# Patient Record
Sex: Male | Born: 1970 | Race: Black or African American | Hispanic: No | Marital: Married | Smoking: Never smoker
Health system: Southern US, Community
[De-identification: ages and names within clinical notes are randomized; demographics above are authoritative.]

## PROBLEM LIST (undated history)

## (undated) DIAGNOSIS — E119 Type 2 diabetes mellitus without complications: Secondary | ICD-10-CM

---

## 2008-08-12 ENCOUNTER — Emergency Department (HOSPITAL_COMMUNITY): Admission: EM | Admit: 2008-08-12 | Discharge: 2008-08-12 | Payer: Self-pay | Admitting: Emergency Medicine

## 2011-11-13 ENCOUNTER — Emergency Department (HOSPITAL_COMMUNITY)
Admission: EM | Admit: 2011-11-13 | Discharge: 2011-11-13 | Disposition: A | Discharge status: Home / Self Care | Payer: BC Managed Care – PPO | Attending: Emergency Medicine | Admitting: Emergency Medicine

## 2011-11-13 ENCOUNTER — Encounter (HOSPITAL_COMMUNITY): Payer: Self-pay | Admitting: Emergency Medicine

## 2011-11-13 DIAGNOSIS — J3489 Other specified disorders of nose and nasal sinuses: Secondary | ICD-10-CM | POA: Insufficient documentation

## 2011-11-13 DIAGNOSIS — R05 Cough: Secondary | ICD-10-CM

## 2011-11-13 DIAGNOSIS — H11419 Vascular abnormalities of conjunctiva, unspecified eye: Secondary | ICD-10-CM | POA: Insufficient documentation

## 2011-11-13 DIAGNOSIS — H5789 Other specified disorders of eye and adnexa: Secondary | ICD-10-CM | POA: Insufficient documentation

## 2011-11-13 DIAGNOSIS — R059 Cough, unspecified: Secondary | ICD-10-CM

## 2011-11-13 DIAGNOSIS — R6883 Chills (without fever): Secondary | ICD-10-CM | POA: Insufficient documentation

## 2011-11-13 DIAGNOSIS — R6889 Other general symptoms and signs: Secondary | ICD-10-CM | POA: Insufficient documentation

## 2011-11-13 DIAGNOSIS — H109 Unspecified conjunctivitis: Secondary | ICD-10-CM

## 2011-11-13 DIAGNOSIS — J069 Acute upper respiratory infection, unspecified: Secondary | ICD-10-CM

## 2011-11-13 DIAGNOSIS — IMO0001 Reserved for inherently not codable concepts without codable children: Secondary | ICD-10-CM | POA: Insufficient documentation

## 2011-11-13 MED ORDER — AZITHROMYCIN 250 MG PO TABS: ORAL_TABLET | ORAL | Status: DC

## 2011-11-13 MED ORDER — AZITHROMYCIN 250 MG PO TABS
500.0000 mg | ORAL_TABLET | Freq: Once | ORAL | Status: AC
  Administered 2011-11-13: 500 mg via ORAL
  Filled 2011-11-13: qty 2

## 2011-11-13 MED ORDER — TOBRAMYCIN 0.3 % OP SOLN
2.0000 [drp] | Freq: Once | OPHTHALMIC | Status: AC
  Administered 2011-11-13: 2 [drp] via OPHTHALMIC

## 2011-11-13 NOTE — ED Notes (Signed)
Pt states has had upper respiratory infection symptoms x 4-5 days and this morning he awoke to drainage in bilateral eyes. Eyes are red and "sore". Throat is red, and "sore".  BBS are congested with fine rales.

## 2011-11-13 NOTE — ED Provider Notes (Signed)
History     CSN: [id number]  Arrival date & time 11/13/11  2126   First MD Initiated Contact with Patient 11/13/11 2204      Chief Complaint  Patient presents with  . Sore Throat  . Eye Drainage  . Cough    (Consider location/radiation/quality/duration/timing/severity/associated sxs/prior treatment) Patient is a 41 y.o. male presenting with cough. The history is provided by the patient. No language interpreter was used.  Cough This is a new problem. The current episode started more than 2 days ago. The problem occurs every few minutes. The problem has not changed since onset.The cough is productive of sputum. There has been no fever. Associated symptoms include chills, rhinorrhea, sore throat, myalgias and eye redness. Pertinent negatives include no chest pain, no sweats, no ear pain, no headaches, no shortness of breath and no wheezing. Associated symptoms comments: Redness and slight drainage to bilateral eyes for several days.  Denies swelling or visual changes. He has tried cough syrup for the symptoms. The treatment provided no relief. He is not a smoker. His past medical history does not include bronchitis, pneumonia or asthma.    History reviewed. No pertinent past medical history.  History reviewed. No pertinent past surgical history.  History reviewed. No pertinent family history.  History  Substance Use Topics  . Smoking status: Never Smoker   . Smokeless tobacco: Not on file  . Alcohol Use: No      Review of Systems  Constitutional: Positive for chills. Negative for fever, activity change and appetite change.  HENT: Positive for congestion, sore throat, rhinorrhea and sneezing. Negative for ear pain, facial swelling, trouble swallowing, neck pain and neck stiffness.   Eyes: Positive for discharge and redness. Negative for visual disturbance.  Respiratory: Positive for cough. Negative for chest tightness, shortness of breath and wheezing.   Cardiovascular: Negative  for chest pain.  Gastrointestinal: Negative for vomiting, abdominal pain and diarrhea.  Genitourinary: Negative for difficulty urinating.  Musculoskeletal: Positive for myalgias.  Neurological: Negative for dizziness, weakness and headaches.  Hematological: Negative for adenopathy.  All other systems reviewed and are negative.    Allergies  Review of patient's allergies indicates no known allergies.  Home Medications  No current outpatient prescriptions on file.  BP 121/83  Pulse 75  Temp(Src) 97.9 F (36.6 C) (Oral)  Resp 16  Ht 6' (1.829 m)  Wt 243 lb (110.224 kg)  BMI 32.96 kg/m2  SpO2 99%  Physical Exam  Nursing note and vitals reviewed. Constitutional: He is oriented to person, place, and time. He appears well-developed and well-nourished. No distress.  HENT:  Head: Normocephalic and atraumatic.  Mouth/Throat: Oropharynx is clear and moist.  Eyes: EOM are normal. Pupils are equal, round, and reactive to light. Right eye exhibits discharge. Right eye exhibits no chemosis and no hordeolum. No foreign body present in the right eye. Left eye exhibits no chemosis and no hordeolum. No foreign body present in the left eye. Right conjunctiva is injected. Left conjunctiva is not injected. Left conjunctiva has no hemorrhage. No scleral icterus.  Neck: Normal range of motion. Neck supple.  Cardiovascular: Normal rate, regular rhythm and normal heart sounds.   Pulmonary/Chest: Effort normal. No respiratory distress.       Coarse lung sounds bilaterally, no rales or wheezes  Abdominal: Soft. He exhibits no distension. There is no tenderness.  Musculoskeletal: Normal range of motion. He exhibits no edema and no tenderness.  Lymphadenopathy:    He has no cervical adenopathy.  Neurological:  He is alert and oriented to person, place, and time. He exhibits normal muscle tone. Coordination normal.  Skin: Skin is warm and dry.    ED Course  Procedures (including critical care  time)       MDM     Patient is alert, NAD.  Non-toxic appearing.  Coarse lung sounds bilaterally w/o wheezes or rales.  Probable bronchitis.  No tachypnea, tachycardia or hypoxia.  Likely bronchitis.  Pt agrees to close f/u with his PMD or to return here if sx's worsen.  Patient / Family / Caregiver understand and agree with initial ED impression and plan with expectations set for ED visit. Pt stable in ED with no significant deterioration in condition.      Jamese Trauger L. [place], [place] 11/16/11 1715

## 2011-11-13 NOTE — ED Notes (Signed)
Patient complaining of cough and sore throat x 4-5 days, redness and draining in bilateral eyes started today.

## 2011-11-16 NOTE — ED Provider Notes (Signed)
Medical screening examination/treatment/procedure(s) were performed by non-physician practitioner and as supervising physician I was immediately available for consultation/collaboration.   Geoffery Lyons, MD 11/16/11 2120

## 2013-11-09 ENCOUNTER — Emergency Department (HOSPITAL_COMMUNITY)
Admission: EM | Admit: 2013-11-09 | Discharge: 2013-11-09 | Disposition: A | Discharge status: Home / Self Care | Payer: BC Managed Care – PPO | Attending: Emergency Medicine | Admitting: Emergency Medicine

## 2013-11-09 ENCOUNTER — Encounter (HOSPITAL_COMMUNITY): Payer: Self-pay | Admitting: Emergency Medicine

## 2013-11-09 DIAGNOSIS — H6120 Impacted cerumen, unspecified ear: Secondary | ICD-10-CM | POA: Insufficient documentation

## 2013-11-09 DIAGNOSIS — Z792 Long term (current) use of antibiotics: Secondary | ICD-10-CM | POA: Insufficient documentation

## 2013-11-09 DIAGNOSIS — H6123 Impacted cerumen, bilateral: Secondary | ICD-10-CM

## 2013-11-09 DIAGNOSIS — H9190 Unspecified hearing loss, unspecified ear: Secondary | ICD-10-CM | POA: Insufficient documentation

## 2013-11-09 MED ORDER — CARBAMIDE PEROXIDE 6.5 % OT SOLN: 5.0000 [drp] | Freq: Two times a day (BID) | OTIC | Status: DC

## 2013-11-09 MED ORDER — ANTIPYRINE-BENZOCAINE 5.4-1.4 % OT SOLN
3.0000 [drp] | Freq: Once | OTIC | Status: AC
  Administered 2013-11-09: 4 [drp] via OTIC
  Filled 2013-11-09: qty 10

## 2013-11-09 MED ORDER — DOCUSATE SODIUM 50 MG/5ML PO LIQD
ORAL | Status: AC
  Filled 2013-11-09: qty 10

## 2013-11-09 MED ORDER — DOCUSATE SODIUM 50 MG/5ML PO LIQD
50.0000 mg | Freq: Once | ORAL | Status: AC
  Administered 2013-11-09: 50 mg via ORAL
  Filled 2013-11-09: qty 10

## 2013-11-09 NOTE — ED Notes (Signed)
Patient states that "I want my ears flushed." Patient c/o right ear problem but wants both ears "flushed."

## 2013-11-09 NOTE — Discharge Instructions (Signed)
Cerumen Impaction A cerumen impaction is when the wax in your ear forms a plug. This plug usually causes reduced hearing. Sometimes it also causes an earache or dizziness. Removing a cerumen impaction can be difficult and painful. The wax sticks to the ear canal. The canal is sensitive and bleeds easily. If you try to remove a heavy wax buildup with a cotton tipped swab, you may push it in further. Irrigation with water, suction, and small ear curettes may be used to clear out the wax. If the impaction is fixed to the skin in the ear canal, ear drops may be needed for a few days to loosen the wax. People who build up a lot of wax frequently can use ear wax removal products available in your local drugstore. SEEK MEDICAL CARE IF:  You develop an earache, increased hearing loss, or marked dizziness. Document Released: 10/28/2004 Document Revised: 12/13/2011 Document Reviewed: 12/18/2009 [employer] Patient Information 2014 [employer].  

## 2013-11-09 NOTE — ED Notes (Signed)
Both ears irrigated again and large hard pellets flushed from ears.  Patient states he can now hear.  Reviewed discharge instructions again

## 2013-11-09 NOTE — ED Notes (Signed)
Both ears irrigated by Dr Patria Maneampos

## 2013-11-09 NOTE — ED Provider Notes (Signed)
CSN: [id number]     Arrival date & time 11/09/13  0000 History  This chart was scribed for Ricardo CoKevin M Owyn Raulston, MD by Quintella ReichertMatthew Underwood, ED scribe.  This patient was seen in room APA05/[facility]05 and the patient's care was started at 12:49 AM.   Chief Complaint  Patient presents with  . Otalgia    The history is provided by the patient. No language interpreter was used.    HPI Comments: Hoy RegisterDennis E Kohn is a 43 y.o. male who presents to the Emergency Department complaining of bilateral cerumen impaction with associated decreased hearing and mild right ear discomfort.  Symptoms are mild and gradually-worsening, with no alleviating or aggravating factors.  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  He has no other complaints at this time.   History reviewed. No pertinent past medical history.  History reviewed. No pertinent past surgical history.  History reviewed. No pertinent family history.   History  Substance Use Topics  . Smoking status: Never Smoker   . Smokeless tobacco: Not on file  . Alcohol Use: No     Review of Systems A complete 10 system review of systems was obtained and all systems are negative except as noted in the HPI and PMH.    Allergies  Review of patient's allergies indicates no known allergies.  Home Medications   Current Outpatient Rx  Name  Route  Sig  Dispense  Refill  . azithromycin (ZITHROMAX Z-PAK) 250 MG tablet      Take one tablet po qd x 4 days   4 tablet   0   . carbamide peroxide (DEBROX) 6.5 % otic solution   Both Ears   Place 5 drops into both ears 2 (two) times daily.   15 mL   0    BP 128/69  Pulse 69  Temp(Src) 97.9 F (36.6 C) (Oral)  Resp 18  Ht 6' (1.829 m)  Wt 246 lb 6.4 oz (111.766 kg)  BMI 33.41 kg/m2  SpO2 100%  Physical Exam  Nursing note and vitals reviewed. Constitutional: He is oriented to person, place, and time. He appears well-developed and well-nourished.   HENT:  Head: Normocephalic.  Bilateral cerumen impaction, right greater than left  Eyes: EOM are normal.  Neck: Normal range of motion.  Pulmonary/Chest: Effort normal.  Abdominal: He exhibits no distension.  Musculoskeletal: Normal range of motion.  Neurological: He is alert and oriented to person, place, and time.  Psychiatric: He has a normal mood and affect.    ED Course  EAR CERUMEN REMOVAL Date/Time: 11/09/2013 3:12 AM Performed by: Ricardo CoAMPOS, Mercades Bajaj M Authorized by: Ricardo CoAMPOS, Chesley Veasey M Consent: Verbal consent obtained. Risks and benefits: risks, benefits and alternatives were discussed Consent given by: patient Patient identity confirmed: verbally with patient Local anesthetic: auralgan. Ceruminolytics applied: Ceruminolytics applied prior to the procedure. Location: right and left ear. Procedure type: curette and irrigation Patient sedated: no Patient tolerance: Patient tolerated the procedure well with no immediate complications. Comments: No success with cerumen removal   (including critical care time)  DIAGNOSTIC STUDIES: Oxygen Saturation is 100% on room air, normal by my interpretation.    COORDINATION OF CARE: 12:51 AM-Discussed treatment plan which includes flushing his ears.  Advised pt that in the future he should see his PCP or an ENT for issues with cerumen impaction, and that the ED is not an appropriate place to seek this treatment.  Pt expressed  understanding and agreed with plan.     Labs Review Labs Reviewed - No data to display  Imaging Review No results found.  EKG Interpretation   None       MDM   1. Impacted cerumen of both ears   unsuccessful. ENT referral   I personally performed the services described in this documentation, which was scribed in my presence. The recorded information has been reviewed and is accurate.      Ricardo Co, MD 11/09/13 [phone]

## 2014-02-03 ENCOUNTER — Emergency Department (HOSPITAL_COMMUNITY): Payer: BC Managed Care – PPO

## 2014-02-03 ENCOUNTER — Encounter (HOSPITAL_COMMUNITY): Payer: Self-pay | Admitting: Emergency Medicine

## 2014-02-03 ENCOUNTER — Emergency Department (HOSPITAL_COMMUNITY)
Admission: EM | Admit: 2014-02-03 | Discharge: 2014-02-04 | Disposition: A | Discharge status: Home / Self Care | Payer: BC Managed Care – PPO | Attending: Emergency Medicine | Admitting: Emergency Medicine

## 2014-02-03 DIAGNOSIS — IMO0002 Reserved for concepts with insufficient information to code with codable children: Secondary | ICD-10-CM

## 2014-02-03 DIAGNOSIS — L03019 Cellulitis of unspecified finger: Secondary | ICD-10-CM | POA: Insufficient documentation

## 2014-02-03 DIAGNOSIS — Z792 Long term (current) use of antibiotics: Secondary | ICD-10-CM | POA: Insufficient documentation

## 2014-02-03 MED ORDER — SULFAMETHOXAZOLE-TMP DS 800-160 MG PO TABS
1.0000 | ORAL_TABLET | Freq: Once | ORAL | Status: AC
  Administered 2014-02-03: 1 via ORAL
  Filled 2014-02-03 (×2): qty 1

## 2014-02-03 MED ORDER — LIDOCAINE HCL 2 % EX GEL
Freq: Once | CUTANEOUS | Status: AC
Start: 2014-02-03 — End: 2014-02-03
  Administered 2014-02-03: 10 via TOPICAL
  Filled 2014-02-03: qty 10

## 2014-02-03 MED ORDER — OXYCODONE-ACETAMINOPHEN 5-325 MG PO TABS
1.0000 | ORAL_TABLET | Freq: Once | ORAL | Status: AC
  Administered 2014-02-03: 1 via ORAL
  Filled 2014-02-03 (×2): qty 1

## 2014-02-03 MED ORDER — SULFAMETHOXAZOLE-TRIMETHOPRIM 800-160 MG PO TABS: 1.0000 | ORAL_TABLET | Freq: Two times a day (BID) | ORAL | Status: AC

## 2014-02-03 MED ORDER — HYDROCODONE-ACETAMINOPHEN 5-325 MG PO TABS: 1.0000 | ORAL_TABLET | ORAL | Status: DC | PRN

## 2014-02-03 NOTE — ED Provider Notes (Signed)
CSN: [id number]     Arrival date & time 02/03/14  2118 History   First MD Initiated Contact with Patient 02/03/14 2150     Chief Complaint  Patient presents with  . Finger Injury     (Consider location/radiation/quality/duration/timing/severity/associated sxs/prior Treatment) Patient is a 43 y.o. male presenting with hand pain. The history is provided by the patient.  Hand Pain This is a new problem. The problem occurs constantly. The problem has been gradually worsening. He has tried nothing for the symptoms.   Ricardo Oneill is a 43 y.o. male who presents to the ED with pain and swelling to the left index finger. The symptoms started about a week ago. He thinks he may have had a hang nail. There was a small amount of drainage at the base of the nail. The pain and redness has increased. He denies fever or chills, nausea or vomiting or other problems.   History reviewed. No pertinent past medical history. History reviewed. No pertinent past surgical history. History reviewed. No pertinent family history. History  Substance Use Topics  . Smoking status: Never Smoker   . Smokeless tobacco: Not on file  . Alcohol Use: No    Review of Systems Negative except as stated in HPI   Allergies  Review of patient's allergies indicates no known allergies.  Home Medications   Prior to Admission medications   Medication Sig Start Date End Date Taking? Authorizing Provider  azithromycin (ZITHROMAX Z-PAK) 250 MG tablet Take one tablet po qd x 4 days 11/13/11   Tammy L. Triplett, PA-C  carbamide peroxide (DEBROX) 6.5 % otic solution Place 5 drops into both ears 2 (two) times daily. 11/09/13   Lyanne CoKevin M Campos, MD   BP 127/77  Pulse 72  Temp(Src) 97.9 F (36.6 C) (Oral)  Resp 18  Ht 6' (1.829 m)  Wt 239 lb 6.4 oz (108.591 kg)  BMI 32.46 kg/m2  SpO2 98% Physical Exam  Nursing note and vitals reviewed. Constitutional: He is oriented to person, place, and time. He appears well-developed and  well-nourished. No distress.  HENT:  Head: Normocephalic.  Eyes: Conjunctivae and EOM are normal.  Neck: Neck supple.  Cardiovascular: Normal rate.   Pulmonary/Chest: Effort normal.  Musculoskeletal: Normal range of motion.       Left hand: He exhibits tenderness and swelling. Normal strength noted.       Hands: Tenderness and swelling noted at the nailbed of the left index finger.   Neurological: He is alert and oriented to person, place, and time. No cranial nerve deficit.  Skin: Skin is warm and dry.  Psychiatric: He has a normal mood and affect. His behavior is normal.    ED Course  Procedures  Topical lidocaine applied to the distal aspect of the left index finger. Using a # 11 blade area at nail bed lifted, small amount of drainage. Patient tolerated well.   Dg Finger Index Left  02/03/2014   CLINICAL DATA:  Finger injury with middle finger swelling at the distal tuft for 1 week.  EXAM: LEFT INDEX FINGER 2+V  COMPARISON:  None.  FINDINGS: There is soft tissue swelling of the distal index finger, especially at the base of the nail. There is no evidence of underlying fracture, dislocation, or osseous infection. No radiodense foreign body.  IMPRESSION: Soft tissue swelling without osseous abnormality.   Electronically Signed   By: Tiburcio PeaJonathan  Watts M.D.   On: 02/03/2014 23:31     MDM  43 y.o. male  with paronychia of left index finger. Stable for discharge without concern for felon at this time. Will start antibiotics and pain management and patient will call Dr. Merrilee SeashoreKuzma's office tomorrow for follow up. He will return here as needed for any problems.    Medication List    TAKE these medications       HYDROcodone-acetaminophen 5-325 MG per tablet  Commonly known as:  NORCO/VICODIN  Take 1 tablet by mouth every 4 (four) hours as needed.     sulfamethoxazole-trimethoprim 800-160 MG per tablet  Commonly known as:  BACTRIM DS,SEPTRA DS  Take 1 tablet by mouth 2 (two) times daily.        ASK your doctor about these medications       GNP MEGA MULTI FOR MEN PO  Take 1 packet by mouth daily.     OVER THE COUNTER MEDICATION  Take 2 capsules by mouth daily. [facility]           Azrael Maddix Orlene OchM Arnez Stoneking, NP 02/04/14 [phone]

## 2014-02-03 NOTE — ED Notes (Signed)
Presents to ED with c/o pain L. Index finger for about a week with increased pain and swelling today.

## 2014-02-03 NOTE — Discharge Instructions (Signed)
Call Dr. Merrilee SeashoreKuzma's office in the morning to schedule follow up. Return here as needed.  Paronychia Paronychia is an inflammatory reaction involving the folds of the skin surrounding the fingernail. This is commonly caused by an infection in the skin around a nail. The most common cause of paronychia is frequent wetting of the hands (as seen with bartenders, food servers, nurses or others who wet their hands). This makes the skin around the fingernail susceptible to infection by bacteria (germs) or fungus. Other predisposing factors are:  Aggressive manicuring.  Nail biting.  Thumb sucking. The most common cause is a staphylococcal (a type of germ) infection, or a fungal (Candida) infection. When caused by a germ, it usually comes on suddenly with redness, swelling, pus and is often painful. It may get under the nail and form an abscess (collection of pus), or form an abscess around the nail. If the nail itself is infected with a fungus, the treatment is usually prolonged and may require oral medicine for up to one year. Your caregiver will determine the length of time treatment is required. The paronychia caused by bacteria (germs) may largely be avoided by not pulling on hangnails or picking at cuticles. When the infection occurs at the tips of the finger it is called felon. When the cause of paronychia is from the herpes simplex virus (HSV) it is called herpetic whitlow. TREATMENT  When an abscess is present treatment is often incision and drainage. This means that the abscess must be cut open so the pus can get out. When this is done, the following home care instructions should be followed. HOME CARE INSTRUCTIONS   It is important to keep the affected fingers very dry. Rubber or plastic gloves over cotton gloves should be used whenever the hand must be placed in water.  Keep wound clean, dry and dressed as suggested by your caregiver between warm soaks or warm compresses.  Soak in warm water for  fifteen to twenty minutes three to four times per day for bacterial infections. Fungal infections are very difficult to treat, so often require treatment for long periods of time.  For bacterial (germ) infections take antibiotics (medicine which kill germs) as directed and finish the prescription, even if the problem appears to be solved before the medicine is gone.  Only take over-the-counter or prescription medicines for pain, discomfort, or fever as directed by your caregiver. SEEK IMMEDIATE MEDICAL CARE IF:  You have redness, swelling, or increasing pain in the wound.  You notice pus coming from the wound.  You have a fever.  You notice a bad smell coming from the wound or dressing. Document Released: 03/16/2001 Document Revised: 12/13/2011 Document Reviewed: 11/15/2008 [facility] Patient Information 2014 [employer], [place].

## 2014-02-03 NOTE — ED Notes (Addendum)
Patient complaining of swelling and possible infection to left index finger x 1 week. Denies known injury.

## 2014-02-04 NOTE — ED Notes (Signed)
Patient with no complaints at this time. Respirations even and unlabored. Skin warm/dry. Discharge instructions reviewed with patient at this time. Patient given opportunity to voice concerns/ask questions. Patient discharged at this time and left Emergency Department with steady gait.   

## 2014-02-06 NOTE — ED Provider Notes (Signed)
Medical screening examination/treatment/procedure(s) were performed by non-physician practitioner and as supervising physician I was immediately available for consultation/collaboration.   Journey Ratterman M Kirbie Stodghill, MD 02/06/14 2208 

## 2015-03-28 ENCOUNTER — Encounter (HOSPITAL_COMMUNITY): Payer: Self-pay | Admitting: *Deleted

## 2015-03-28 ENCOUNTER — Emergency Department (HOSPITAL_COMMUNITY)
Admission: EM | Admit: 2015-03-28 | Discharge: 2015-03-28 | Disposition: A | Discharge status: Home / Self Care | Payer: BLUE CROSS/BLUE SHIELD | Attending: Emergency Medicine | Admitting: Emergency Medicine

## 2015-03-28 DIAGNOSIS — H109 Unspecified conjunctivitis: Secondary | ICD-10-CM | POA: Diagnosis not present

## 2015-03-28 DIAGNOSIS — H578 Other specified disorders of eye and adnexa: Secondary | ICD-10-CM | POA: Diagnosis present

## 2015-03-28 DIAGNOSIS — J40 Bronchitis, not specified as acute or chronic: Secondary | ICD-10-CM | POA: Diagnosis not present

## 2015-03-28 MED ORDER — CIPROFLOXACIN HCL 0.3 % OP SOLN
OPHTHALMIC | Status: AC
  Filled 2015-03-28: qty 2.5

## 2015-03-28 MED ORDER — CIPROFLOXACIN HCL 0.3 % OP SOLN
2.0000 [drp] | OPHTHALMIC | Status: DC
  Administered 2015-03-28: 2 [drp] via OPHTHALMIC
  Filled 2015-03-28: qty 2.5

## 2015-03-28 NOTE — Discharge Instructions (Signed)

## 2015-03-28 NOTE — ED Notes (Signed)
Pt states he was mowing grass yesterday & right eye became irritated. Has not improved even after using the cleareyes drops.

## 2015-03-28 NOTE — ED Provider Notes (Signed)
CSN: [id number]     Arrival date & time 03/28/15  6767 History   First MD Initiated Contact with Patient 03/28/15 [phone]     Chief Complaint  Patient presents with  . Eye Problem      HPI Patient reports drainage from his right eye with past 4 hours.  He states this began after mowing the lawn.  He awoke with some crusting of his right eyelid.  He reports his vision is normal.  He denies eye pain.  He reports ongoing drainage of fluid from his right eye.  He reports productive cough for the past 4-5 days without shortness of breath.  No fevers or chills.  He reports a stuffy nose over the past several days as well.  His symptoms are mild in severity.  Nothing worsens or improves his symptoms.  He's tried over-the-counter eyedrops without improvement.   History reviewed. No pertinent past medical history. History reviewed. No pertinent past surgical history. No family history on file. History  Substance Use Topics  . Smoking status: Never Smoker   . Smokeless tobacco: Not on file  . Alcohol Use: No    Review of Systems  All other systems reviewed and are negative.     Allergies  Review of patient's allergies indicates no known allergies.  Home Medications   Prior to Admission medications   Medication Sig Start Date End Date Taking? Authorizing Provider  Multiple Vitamins-Minerals (GNP MEGA MULTI FOR MEN PO) Take 1 packet by mouth daily.   Yes Historical Provider, MD  OVER THE COUNTER MEDICATION Take 2 capsules by mouth daily. [employer] STAMINOL ULTRA   Yes Historical Provider, MD  HYDROcodone-acetaminophen (NORCO/VICODIN) 5-325 MG per tablet Take 1 tablet by mouth every 4 (four) hours as needed. 02/03/14   Hope Orlene Och, NP   BP 123/86 mmHg  Pulse 75  Temp(Src) 97.9 F (36.6 C) (Oral)  Resp 16  Ht 6' (1.829 m)  Wt 245 lb (111.131 kg)  BMI 33.22 kg/m2  SpO2 98% Physical Exam  Constitutional: He is oriented to person, place, and time. He appears well-developed and well-nourished.   HENT:  Head: Normocephalic and atraumatic.  Eyes: EOM are normal.  Small clear drainage from his right eye.  Patient with injected conjunctiva of right eye.  Patient with mild chemosis and right.  Pupil on his right eye is normal.   Neck: Normal range of motion.  Cardiovascular: Normal rate, regular rhythm, normal heart sounds and intact distal pulses.   Pulmonary/Chest: Effort normal and breath sounds normal. No respiratory distress.  Abdominal: Soft. He exhibits no distension. There is no tenderness.  Musculoskeletal: Normal range of motion.  Neurological: He is alert and oriented to person, place, and time.  Skin: Skin is warm and dry.  Psychiatric: He has a normal mood and affect. Judgment normal.  Nursing note and vitals reviewed.   ED Course  Procedures (including critical care time) Labs Review Labs Reviewed - No data to display  Imaging Review No results found.   EKG Interpretation None      MDM   Final diagnoses:  Conjunctivitis of right eye  Bronchitis    Likely viral conjunctivitis but could have some degree of allergy. Vision normal. Home on cipro eye drops. pcp follow up.     Azalia Bilis, MD 03/28/15 [phone]

## 2015-09-14 IMAGING — CR DG FINGER INDEX 2+V*L*
1 series · 1 of 1 positions shown · non-contrast
Comparison: None.

CLINICAL DATA: Finger injury with middle finger swelling at the
distal tuft for 1 week.

EXAM:
LEFT INDEX FINGER 2+V

[view not recorded]
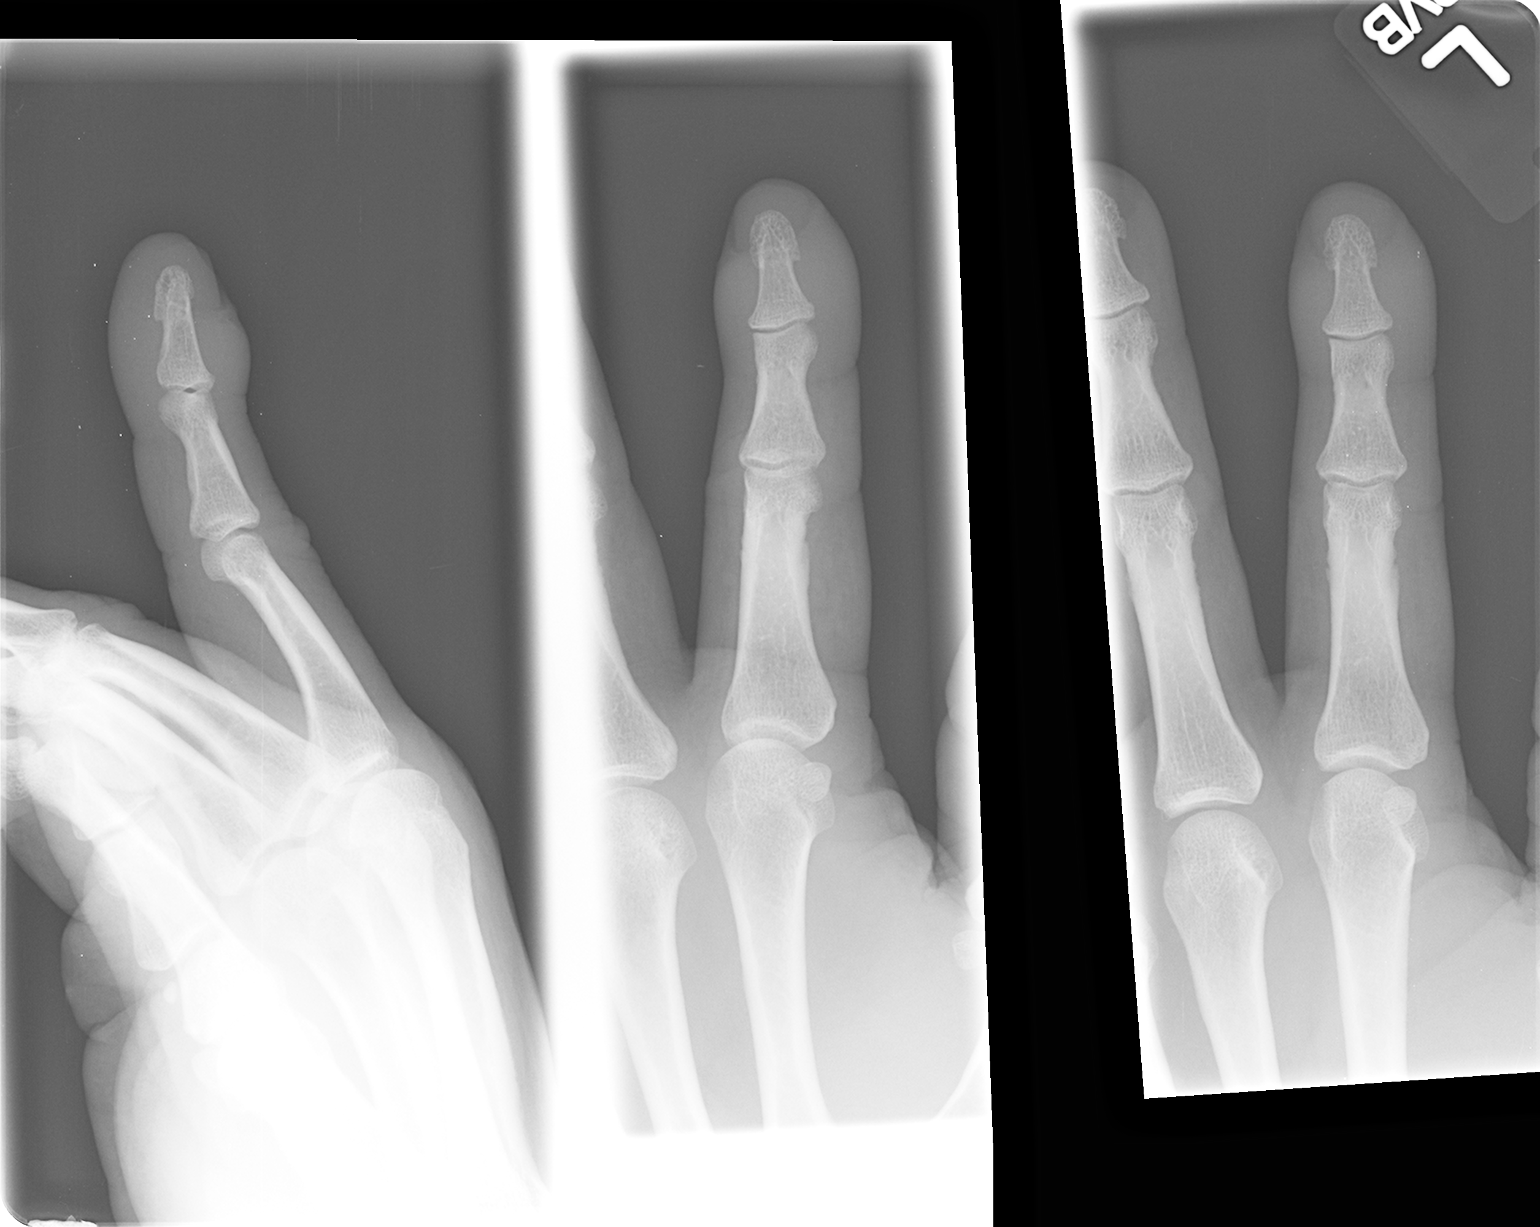

[1 of 1 positions shown; findings below may reference images not displayed]

FINDINGS: There is soft tissue swelling of the distal index finger, especially
at the base of the nail. There is no evidence of underlying
fracture, dislocation, or osseous infection. No radiodense foreign
body.
IMPRESSION: Soft tissue swelling without osseous abnormality.

## 2015-10-15 ENCOUNTER — Emergency Department (HOSPITAL_COMMUNITY)
Admission: EM | Admit: 2015-10-15 | Discharge: 2015-10-15 | Disposition: A | Discharge status: Home / Self Care | Payer: BLUE CROSS/BLUE SHIELD | Attending: Emergency Medicine | Admitting: Emergency Medicine

## 2015-10-15 ENCOUNTER — Encounter (HOSPITAL_COMMUNITY): Payer: Self-pay | Admitting: *Deleted

## 2015-10-15 DIAGNOSIS — H9193 Unspecified hearing loss, bilateral: Secondary | ICD-10-CM | POA: Diagnosis present

## 2015-10-15 DIAGNOSIS — H6123 Impacted cerumen, bilateral: Secondary | ICD-10-CM | POA: Diagnosis not present

## 2015-10-15 DIAGNOSIS — Z79899 Other long term (current) drug therapy: Secondary | ICD-10-CM | POA: Insufficient documentation

## 2015-10-15 DIAGNOSIS — J3489 Other specified disorders of nose and nasal sinuses: Secondary | ICD-10-CM | POA: Diagnosis not present

## 2015-10-15 MED ORDER — HYDROGEN PEROXIDE 3 % EX SOLN
CUTANEOUS | Status: AC
  Filled 2015-10-15: qty 473

## 2015-10-15 NOTE — ED Notes (Signed)
Pt states that he has earwax build up in both ears, hx of same

## 2015-10-15 NOTE — ED Notes (Signed)
Nurse flushed both of patients ears. Cerumen removed from both ears. Ear canal and ear drum now visible.

## 2015-10-15 NOTE — Discharge Instructions (Signed)
Cerumen Impaction The structures of the external ear canal secrete a waxy substance known as cerumen. Excess cerumen can build up in the ear canal, causing a condition known as cerumen impaction. Cerumen impaction can cause ear pain and disrupt the function of the ear. The rate of cerumen production differs for each individual. In certain individuals, the configuration of the ear canal may decrease his or her ability to naturally remove cerumen. CAUSES Cerumen impaction is caused by excessive cerumen production or buildup. RISK FACTORS  Frequent use of swabs to clean ears.  Having narrow ear canals.  Having eczema.  Being dehydrated. SIGNS AND SYMPTOMS  Diminished hearing.  Ear drainage.  Ear pain.  Ear itch. TREATMENT Treatment may involve:  Over-the-counter or prescription ear drops to soften the cerumen.  Removal of cerumen by a health care provider. This may be done with:  Irrigation with warm water. This is the most common method of removal.  Ear curettes and other instruments.  Surgery. This may be done in severe cases. HOME CARE INSTRUCTIONS  Take medicines only as directed by your health care provider.  Do not insert objects into the ear with the intent of cleaning the ear. PREVENTION  Do not insert objects into the ear, even with the intent of cleaning the ear. Removing cerumen as a part of normal hygiene is not necessary, and the use of swabs in the ear canal is not recommended.  Drink enough water to keep your urine clear or pale yellow.  Control your eczema if you have it. SEEK MEDICAL CARE IF:  You develop ear pain.  You develop bleeding from the ear.  The cerumen does not clear after you use ear drops as directed.   This information is not intended to replace advice given to you by your health care provider. Make sure you discuss any questions you have with your health care provider.   Document Released: 10/28/2004 Document Revised: 10/11/2014  Document Reviewed: 05/07/2015 Elsevier Interactive Patient Education 2016 [employer]  

## 2015-10-17 NOTE — ED Provider Notes (Signed)
CSN: [id number]     Arrival date & time 10/15/15  1819 History   First MD Initiated Contact with Patient 10/15/15 1825     Chief Complaint  Patient presents with  . Cerumen Impaction     (Consider location/radiation/quality/duration/timing/severity/associated sxs/prior Treatment) The history is provided by the patient.   Ricardo Oneill is a 45 y.o. male presenting requesting cerumen impaction removal which he states he has on a frequent basis.  He denies pain or drainage from the ears, does endorse decreased hearing acuity and suspects he is impacted.  He has had no fevers, sore throat, uri sx, dizziness, tinnitus or other complaint.  He has had no treatment prior to arrival.     History reviewed. No pertinent past medical history. History reviewed. No pertinent past surgical history. History reviewed. No pertinent family history. Social History  Substance Use Topics  . Smoking status: Never Smoker   . Smokeless tobacco: None  . Alcohol Use: No    Review of Systems  Constitutional: Negative for fever and chills.  HENT: Positive for hearing loss and rhinorrhea. Negative for congestion, ear pain, sinus pressure, sore throat, trouble swallowing and voice change.   Eyes: Negative for discharge.  Respiratory: Negative for cough, shortness of breath, wheezing and stridor.   Cardiovascular: Negative for chest pain.  Gastrointestinal: Negative for abdominal pain.  Genitourinary: Negative.       Allergies  Review of patient's allergies indicates no known allergies.  Home Medications   Prior to Admission medications   Medication Sig Start Date End Date Taking? Authorizing Provider  HYDROcodone-acetaminophen (NORCO/VICODIN) 5-325 MG per tablet Take 1 tablet by mouth every 4 (four) hours as needed. Patient not taking: Reported on 10/15/2015 02/03/14   Janne NapoleonHope M Neese, NP  Multiple Vitamins-Minerals (GNP MEGA MULTI FOR MEN PO) Take 1 packet by mouth daily.    Historical Provider, MD   OVER THE COUNTER MEDICATION Take 2 capsules by mouth daily. [employer] STAMINOL ULTRA    Historical Provider, MD   BP 131/74 mmHg  Pulse 77  Temp(Src) 97.5 F (36.4 C) (Oral)  Resp 18  Ht 6' (1.829 m)  Wt 115.667 kg  BMI 34.58 kg/m2  SpO2 98% Physical Exam  Constitutional: He is oriented to person, place, and time. He appears well-developed and well-nourished.  HENT:  Head: Normocephalic and atraumatic.  Right Ear: Tympanic membrane and ear canal normal.  Left Ear: Tympanic membrane and ear canal normal.  Nose: No mucosal edema or rhinorrhea.  Mouth/Throat: Uvula is midline, oropharynx is clear and moist and mucous membranes are normal. No oropharyngeal exudate, posterior oropharyngeal edema, posterior oropharyngeal erythema or tonsillar abscesses.  Bilateral cerumen impaction, appears soft,  Unable to visualize tm's.   Eyes: Conjunctivae are normal.  Cardiovascular: Normal rate and normal heart sounds.   Pulmonary/Chest: Effort normal. No respiratory distress. He has no wheezes. He has no rales.  Abdominal: Soft. There is no tenderness.  Musculoskeletal: Normal range of motion.  Neurological: He is alert and oriented to person, place, and time.  Skin: Skin is warm and dry. No rash noted.  Psychiatric: He has a normal mood and affect.    ED Course  Procedures (including critical care time)  Bilateral cerumen removed via NS flush using catheter tip and syringe by RN. Pt tolerated well.    Labs Review Labs Reviewed - No data to display  Imaging Review No results found. I have personally reviewed and evaluated these images and lab results as part of my  medical decision-making.   EKG Interpretation None      MDM   Final diagnoses:  Cerumen impaction, bilateral    Re-exam after flushing - pt with no acute findings, TM's clear, outer canal clear without trauma.  Mild erythema left base of canal, no swelling, no drainage.    Burgess Amor, PA-C 10/17/15 1347  Samuel Jester, DO 10/18/15 1605

## 2015-11-27 ENCOUNTER — Emergency Department (HOSPITAL_COMMUNITY): Payer: BLUE CROSS/BLUE SHIELD

## 2015-11-27 ENCOUNTER — Encounter (HOSPITAL_COMMUNITY): Payer: Self-pay | Admitting: Emergency Medicine

## 2015-11-27 ENCOUNTER — Emergency Department (HOSPITAL_COMMUNITY)
Admission: EM | Admit: 2015-11-27 | Discharge: 2015-11-27 | Disposition: A | Discharge status: Home / Self Care | Payer: BLUE CROSS/BLUE SHIELD | Attending: Emergency Medicine | Admitting: Emergency Medicine

## 2015-11-27 DIAGNOSIS — H109 Unspecified conjunctivitis: Secondary | ICD-10-CM

## 2015-11-27 DIAGNOSIS — J209 Acute bronchitis, unspecified: Secondary | ICD-10-CM | POA: Insufficient documentation

## 2015-11-27 DIAGNOSIS — J40 Bronchitis, not specified as acute or chronic: Secondary | ICD-10-CM

## 2015-11-27 DIAGNOSIS — Z79899 Other long term (current) drug therapy: Secondary | ICD-10-CM | POA: Diagnosis not present

## 2015-11-27 DIAGNOSIS — R05 Cough: Secondary | ICD-10-CM | POA: Diagnosis present

## 2015-11-27 MED ORDER — AZITHROMYCIN 250 MG PO TABS
500.0000 mg | ORAL_TABLET | Freq: Once | ORAL | Status: AC
  Administered 2015-11-27: 500 mg via ORAL
  Filled 2015-11-27: qty 2

## 2015-11-27 MED ORDER — AZITHROMYCIN 250 MG PO TABS: ORAL_TABLET | ORAL | Status: DC

## 2015-11-27 MED ORDER — TOBRAMYCIN 0.3 % OP OINT
TOPICAL_OINTMENT | Freq: Three times a day (TID) | OPHTHALMIC | Status: DC
  Filled 2015-11-27: qty 3.5

## 2015-11-27 MED ORDER — TOBRAMYCIN 0.3 % OP SOLN
1.0000 [drp] | Freq: Four times a day (QID) | OPHTHALMIC | Status: DC
  Administered 2015-11-27: 1 [drp] via OPHTHALMIC
  Filled 2015-11-27: qty 5

## 2015-11-27 NOTE — ED Provider Notes (Signed)
CSN: [id number]     Arrival date & time 11/27/15  2126 History   First MD Initiated Contact with Patient 11/27/15 2145     Chief Complaint  Patient presents with  . Cough  . Nasal Congestion     (Consider location/radiation/quality/duration/timing/severity/associated sxs/prior Treatment) Patient is a 45 y.o. male presenting with cough. The history is provided by the patient.  Cough Cough characteristics:  Productive Associated symptoms: eye discharge    Ricardo Oneill is a 45 y.o. male who presents to the ED with cough that is productive with yellow/green sputum and nasal congestion that started 2 days ago. He also complains of his eyes itching and being red and having crusting on the lids in the mornings.    History reviewed. No pertinent past medical history. History reviewed. No pertinent past surgical history. History reviewed. No pertinent family history. Social History  Substance Use Topics  . Smoking status: Never Smoker   . Smokeless tobacco: None  . Alcohol Use: No    Review of Systems  HENT: Positive for congestion.   Eyes: Positive for photophobia, discharge, redness and itching.  Respiratory: Positive for cough.   all other systems negative    Allergies  Review of patient's allergies indicates no known allergies.  Home Medications   Prior to Admission medications   Medication Sig Start Date End Date Taking? Authorizing Provider  azithromycin (ZITHROMAX) 250 MG tablet Take one tablet daily for infection 11/27/15   [facility] Orlene Och, NP  HYDROcodone-acetaminophen (NORCO/VICODIN) 5-325 MG per tablet Take 1 tablet by mouth every 4 (four) hours as needed. Patient not taking: Reported on 10/15/2015 02/03/14   Janne Napoleon, NP  Multiple Vitamins-Minerals (GNP MEGA MULTI FOR MEN PO) Take 1 packet by mouth daily.    Historical Provider, MD  OVER THE COUNTER MEDICATION Take 2 capsules by mouth daily. [employer] STAMINOL ULTRA    Historical Provider, MD   BP 120/79 mmHg  Pulse  79  Temp(Src) 98.1 F (36.7 C) (Oral)  Resp 14  Ht 6' (1.829 m)  Wt 113.853 kg  BMI 34.03 kg/m2  SpO2 99% Physical Exam  Constitutional: He is oriented to person, place, and time. He appears well-developed and well-nourished. No distress.  HENT:  Head: Normocephalic.  Eyes: EOM are normal. Pupils are equal, round, and reactive to light. Right eye exhibits discharge. Left eye exhibits discharge. Right conjunctiva is injected. Left conjunctiva is injected.  Neck: Neck supple.  Cardiovascular: Normal rate and regular rhythm.   Pulmonary/Chest: Effort normal. He has no rales.  Abdominal: Soft. Bowel sounds are normal. There is no tenderness.  Musculoskeletal: Normal range of motion.  Neurological: He is alert and oriented to person, place, and time. No cranial nerve deficit.  Skin: Skin is warm and dry.  Psychiatric: He has a normal mood and affect. His behavior is normal.  Nursing note and vitals reviewed.   ED Course  Procedures (including critical care time) Labs Review Labs Reviewed - No data to display  Imaging Review Dg Chest 2 View  11/27/2015  CLINICAL DATA:  Cough and congestion for 2 days. EXAM: CHEST  2 VIEW COMPARISON:  None. FINDINGS: The cardiomediastinal contours are normal. The lungs are clear. Pulmonary vasculature is normal. No consolidation, pleural effusion, or pneumothorax. No acute osseous abnormalities are seen. IMPRESSION: No acute pulmonary process. Electronically Signed   By: Rubye Oaks M.D.   On: 11/27/2015 21:42    MDM  45 y.o. male with productive cough, congestion and eye  redness and itching and discharge stable for d/c without fever and does not appear toxic. Will treat for bronchitis and conjunctivitis. First dose of Tobramycin Opth drops applied to eyes prior to d/c and first dose of Zithromax given. Discussed with the patient clinical findings and plan of careand all questioned fully answered. He will return if any problems arise.  Final  diagnoses:  Bronchitis  Bilateral conjunctivitis       Janne Napoleon, NP 11/28/15 1610  Vanetta Mulders, MD 12/01/15 [phone]

## 2015-11-27 NOTE — ED Notes (Signed)
Patient complaining of cough and nasal congestion x 2 days. Also complaining of "pink eyes that are draining and crusting."

## 2015-11-27 NOTE — Discharge Instructions (Signed)
Use Robitussin for cough. Follow up with your doctor. Return here as needed.

## 2016-04-13 ENCOUNTER — Encounter (HOSPITAL_COMMUNITY): Payer: Self-pay | Admitting: Emergency Medicine

## 2016-04-13 ENCOUNTER — Emergency Department (HOSPITAL_COMMUNITY)
Admission: EM | Admit: 2016-04-13 | Discharge: 2016-04-13 | Disposition: A | Discharge status: Home / Self Care | Payer: BLUE CROSS/BLUE SHIELD | Attending: Emergency Medicine | Admitting: Emergency Medicine

## 2016-04-13 DIAGNOSIS — H6691 Otitis media, unspecified, right ear: Secondary | ICD-10-CM | POA: Diagnosis not present

## 2016-04-13 DIAGNOSIS — H9201 Otalgia, right ear: Secondary | ICD-10-CM | POA: Diagnosis present

## 2016-04-13 MED ORDER — AMOXICILLIN 500 MG PO CAPS: 500.0000 mg | ORAL_CAPSULE | Freq: Three times a day (TID) | ORAL | Status: DC

## 2016-04-13 NOTE — Discharge Instructions (Signed)

## 2016-04-13 NOTE — ED Provider Notes (Signed)
CSN: [id number]     Arrival date & time 04/13/16  0845 History   First MD Initiated Contact with Patient 04/13/16 0848     Chief Complaint  Patient presents with  . Otalgia     (Consider location/radiation/quality/duration/timing/severity/associated sxs/prior Treatment) Patient is a 45 y.o. male presenting with ear pain. The history is provided by the patient. No language interpreter was used.  Otalgia Location:  Bilateral Quality:  Aching Severity:  Moderate Onset quality:  Gradual Timing:  Constant Progression:  Worsening Chronicity:  New Relieved by:  Nothing Ineffective treatments:  None tried Associated symptoms: no rhinorrhea and no sore throat   Risk factors: no chronic ear infection     History reviewed. No pertinent past medical history. History reviewed. No pertinent past surgical history. No family history on file. Social History  Substance Use Topics  . Smoking status: Never Smoker   . Smokeless tobacco: None  . Alcohol Use: No    Review of Systems  HENT: Positive for ear pain. Negative for rhinorrhea and sore throat.   All other systems reviewed and are negative.     Allergies  Review of patient's allergies indicates no known allergies.  Home Medications   Prior to Admission medications   Medication Sig Start Date End Date Taking? Authorizing Provider  amoxicillin (AMOXIL) 500 MG capsule Take 1 capsule (500 mg total) by mouth 3 (three) times daily. 04/13/16   Elson Areas, PA-C  azithromycin [facility]) 250 MG tablet Take one tablet daily for infection 11/27/15   [facility] Orlene Och, NP  HYDROcodone-acetaminophen (NORCO/VICODIN) 5-325 MG per tablet Take 1 tablet by mouth every 4 (four) hours as needed. Patient not taking: Reported on 10/15/2015 02/03/14   Janne Napoleon, NP  Multiple Vitamins-Minerals (GNP MEGA MULTI FOR MEN PO) Take 1 packet by mouth daily.    Historical Provider, MD  OVER THE COUNTER MEDICATION Take 2 capsules by mouth daily. [employer] STAMINOL  ULTRA    Historical Provider, MD   BP 117/77 mmHg  Pulse 75  Temp(Src) 97.7 F (36.5 C) (Oral)  Resp 18  Ht 6' (1.829 m)  Wt 113.399 kg  BMI 33.90 kg/m2  SpO2 95% Physical Exam  Constitutional: He is oriented to person, place, and time. He appears well-developed and well-nourished.  HENT:  Head: Normocephalic and atraumatic.  Right canla clear,  Erythema tm.  Left canal wax  Eyes: Conjunctivae and EOM are normal.  Neck: Normal range of motion.  Cardiovascular: Normal rate and normal heart sounds.   Pulmonary/Chest: Effort normal.  Abdominal: Soft. He exhibits no distension.  Musculoskeletal: Normal range of motion.  Neurological: He is alert and oriented to person, place, and time.  Skin: Skin is warm.  Psychiatric: He has a normal mood and affect.  Nursing note and vitals reviewed.   ED Course  Procedures (including critical care time) Labs Review Labs Reviewed - No data to display  Imaging Review No results found. I have personally reviewed and evaluated these images and lab results as part of my medical decision-making.   EKG Interpretation None      MDM wax irrigated out of left canal.  Pt given rx for amoxicillin for possible infection to left tm   Final diagnoses:  Right otitis media, recurrence not specified, unspecified chronicity, unspecified otitis media type   Meds ordered this encounter  Medications  . amoxicillin (AMOXIL) 500 MG capsule    Sig: Take 1 capsule (500 mg total) by mouth 3 (three) times daily.  Dispense:  30 capsule    Refill:  0    Order Specific Question:  Supervising Provider    Answer:  Eber HongMILLER, BRIAN [[id number]]    An After Visit Summary was printed and given to the patient.   Lonia SkinnerLeslie K MeccaSofia, PA-C 04/13/16 1013  Lavera Guiseana Duo Liu, MD 04/14/16 [phone]

## 2016-04-13 NOTE — ED Notes (Signed)
Left ear irrigated with removal of large wax ball. Pt tolerated well

## 2016-04-13 NOTE — ED Notes (Signed)
Pt reports bilateral otalgia. States ears feel stopped up. Reports he usually comes to ED to have ears cleaned.

## 2016-09-04 ENCOUNTER — Emergency Department (HOSPITAL_COMMUNITY)
Admission: EM | Admit: 2016-09-04 | Discharge: 2016-09-04 | Disposition: A | Discharge status: Home / Self Care | Payer: BLUE CROSS/BLUE SHIELD | Attending: Emergency Medicine | Admitting: Emergency Medicine

## 2016-09-04 ENCOUNTER — Encounter (HOSPITAL_COMMUNITY): Payer: Self-pay | Admitting: Emergency Medicine

## 2016-09-04 DIAGNOSIS — Z79899 Other long term (current) drug therapy: Secondary | ICD-10-CM | POA: Insufficient documentation

## 2016-09-04 DIAGNOSIS — M79644 Pain in right finger(s): Secondary | ICD-10-CM | POA: Diagnosis present

## 2016-09-04 DIAGNOSIS — L03011 Cellulitis of right finger: Secondary | ICD-10-CM

## 2016-09-04 MED ORDER — IBUPROFEN 600 MG PO TABS: 600.0000 mg | ORAL_TABLET | Freq: Four times a day (QID) | ORAL | 0 refills | Status: DC | PRN

## 2016-09-04 MED ORDER — LIDOCAINE HCL (PF) 1 % IJ SOLN
5.0000 mL | Freq: Once | INTRAMUSCULAR | Status: AC
  Administered 2016-09-04: 5 mL
  Filled 2016-09-04: qty 5

## 2016-09-04 MED ORDER — CEPHALEXIN 500 MG PO CAPS: 500.0000 mg | ORAL_CAPSULE | Freq: Two times a day (BID) | ORAL | 0 refills | Status: AC

## 2016-09-04 NOTE — ED Provider Notes (Signed)
[facility]-EMERGENCY DEPT Provider Note   CSN: [id number] Arrival date & time: 09/04/16  1244  By signing my name below, I, Ricardo Oneill, attest that this documentation has been prepared under the direction and in the presence of non-physician practitioner, Audry Piliyler Maleiyah Releford, PA-C. Electronically Signed: Nelwyn SalisburyJoshua Oneill, Scribe. 09/04/2016. 1:06 PM.  History   Chief Complaint Chief Complaint  Patient presents with  . Hand Pain   HPI  HPI Comments:  Ricardo Oneill is an otherwise healthy 45 y.o. male who presents to the Emergency Department complaining of sudden-onset constant right thumbnail pain beginning about 2 days ago. He describes symptoms as an 8/10 throbbing pain exacerbated by palpation. Pt states that he was clipping his nails that day and that he has been told that he clips his nails too close. He reports associated joint swelling and joint pain. He denies any fever, wound, weakness or numbness.  History reviewed. No pertinent past medical history.  There are no active problems to display for this patient.   History reviewed. No pertinent surgical history.   Home Medications    Prior to Admission medications   Medication Sig Start Date End Date Taking? Authorizing Provider  amoxicillin (AMOXIL) 500 MG capsule Take 1 capsule (500 mg total) by mouth 3 (three) times daily. 04/13/16   Elson AreasLeslie K Sofia, PA-C  azithromycin [facility](ZITHROMAX) 250 MG tablet Take one tablet daily for infection 11/27/15   [facility] Orlene OchM Neese, NP  HYDROcodone-acetaminophen (NORCO/VICODIN) 5-325 MG per tablet Take 1 tablet by mouth every 4 (four) hours as needed. Patient not taking: Reported on 10/15/2015 02/03/14   Janne NapoleonHope M Neese, NP  Multiple Vitamins-Minerals (GNP MEGA MULTI FOR MEN PO) Take 1 packet by mouth daily.    Historical Provider, MD  OVER THE COUNTER MEDICATION Take 2 capsules by mouth daily. [employer] STAMINOL ULTRA    Historical Provider, MD    Family History History reviewed. No pertinent family history.  Social  History Social History  Substance Use Topics  . Smoking status: Never Smoker  . Smokeless tobacco: Never Used  . Alcohol use No     Allergies   Patient has no known allergies.   Review of Systems Review of Systems  Constitutional: Negative for fever.  Musculoskeletal: Positive for joint swelling and myalgias (Right distal thumb). Negative for arthralgias.  Skin: Negative for wound.  Neurological: Negative for weakness and numbness.     Physical Exam Updated Vital Signs BP 146/85 (BP Location: Right Arm)   Pulse 66   Temp 98.4 F (36.9 C) (Oral)   Resp 20   Ht 6' (1.829 m)   Wt 250 lb (113.4 kg)   SpO2 100%   BMI 33.91 kg/m   Physical Exam  Constitutional: He is oriented to person, place, and time. He appears well-developed and well-nourished. No distress.  HENT:  Head: Normocephalic and atraumatic.  Eyes: Conjunctivae are normal.  Cardiovascular: Normal rate.   Pulmonary/Chest: Effort normal.  Abdominal: He exhibits no distension.  Neurological: He is alert and oriented to person, place, and time.  Skin: Skin is warm and dry. Capillary refill takes less than 2 seconds.  Right thumb tender to palpation. Lateral cuticle minimal swelling noted. No erythema. ROM intact. Cap refill <2. Neurovascularly intact.   Psychiatric: He has a normal mood and affect.  Nursing note and vitals reviewed.    ED Treatments / Results  DIAGNOSTIC STUDIES:  Oxygen Saturation is 100% on RA, normal by my interpretation.    COORDINATION OF CARE:  1:16 PM  Discussed treatment plan with pt at bedside and pt agreed to plan.  Labs (all labs ordered are listed, but only abnormal results are displayed) Labs Reviewed - No data to display  EKG  EKG Interpretation None       Radiology No results found.  Procedures .Marland Kitchen.Incision and Drainage Date/Time: 09/04/2016 1:22 PM Performed by: Audry PiliMOHR, Anala Whisenant Authorized by: Audry PiliMOHR, Yaeli Hartung   Consent:    Consent obtained:  Verbal   Consent  given by:  Patient Location:    Location:  Upper extremity   Upper extremity location:  Finger   Finger location:  R thumb Pre-procedure details:    Skin preparation:  Antiseptic wash Anesthesia (see MAR for exact dosages):    Anesthesia method:  Local infiltration   Local anesthetic:  Lidocaine 1% w/o epi Procedure type:    Complexity:  Simple Procedure details:    Needle aspiration: no     Incision types:  Single straight   Incision depth:  Dermal   Scalpel blade:  11   Drainage:  Purulent   Drainage amount:  Moderate Post-procedure details:    Patient tolerance of procedure:  Tolerated well, no immediate complications    (including critical care time)  Medications Ordered in ED Medications - No data to display   Initial Impression / Assessment and Plan / ED Course  I have reviewed the triage vital signs and the nursing notes.  Pertinent labs & imaging results that were available during my care of the patient were reviewed by me and considered in my medical decision making (see chart for details).  Clinical Course     Final Clinical Impressions(s) / ED Diagnoses     {I have reviewed the relevant previous healthcare records.  {I obtained HPI from historian.   ED Course:  Assessment:  Patient with paronychia of right thumb. Incision and drainage performed in the ED today.  Incision was not large enough to warrant packing or drain placement. Wound recheck in 2 days. Supportive care and return precautions discussed.  Pt sent home with ABX. The patient appears reasonably screened and/or stabilized for discharge and I doubt any other emergent medical condition requiring further screening, evaluation, or treatment in the ED prior to discharge.   Disposition/Plan:  DC Home Additional Verbal discharge instructions given and discussed with patient.  Pt Instructed to f/u with PCP in the next week for evaluation and treatment of symptoms. Return precautions given Pt  acknowledges and agrees with plan  Supervising Physician No att. providers found  Final diagnoses:  Paronychia of right thumb    New Prescriptions New Prescriptions   No medications on file   I personally performed the services described in this documentation, which was scribed in my presence. The recorded information has been reviewed and is accurate.    Audry Piliyler Elwanda Moger, PA-C 09/04/16 1418    Bethann BerkshireJoseph Zammit, MD 09/05/16 [phone]

## 2016-09-04 NOTE — Discharge Instructions (Signed)
Please read and follow all provided instructions.  Your diagnoses today include:  1. Paronychia of right thumb     Tests performed today include: Vital signs. See below for your results today.   Medications prescribed:  Take as prescribed   Home care instructions:  Follow any educational materials contained in this packet.  Follow-up instructions: Please follow-up with your primary care provider for further evaluation of symptoms and treatment   Return instructions:  Please return to the Emergency Department if you do not get better, if you get worse, or new symptoms OR  - Fever (temperature greater than 101.64F)  - Bleeding that does not stop with holding pressure to the area    -Severe pain (please note that you may be more sore the day after your accident)  - Chest Pain  - Difficulty breathing  - Severe nausea or vomiting  - Inability to tolerate food and liquids  - Passing out  - Skin becoming red around your wounds  - Change in mental status (confusion or lethargy)  - New numbness or weakness    Please return if you have any other emergent concerns.  Additional Information:  Your vital signs today were: BP 146/85 (BP Location: Right Arm)    Pulse 66    Temp 98.4 F (36.9 C) (Oral)    Resp 20    Ht 6' (1.829 m)    Wt 113.4 kg    SpO2 100%    BMI 33.91 kg/m  If your blood pressure (BP) was elevated above 135/85 this visit, please have this repeated by your doctor within one month. ---------------

## 2016-09-04 NOTE — ED Triage Notes (Signed)
Patient c/o right thumb nail pain. Per patient started Thursday and he was unable to sleep due to pain last night. Patient states he cut his fingernails Wednesday. No obvious injury or drainage noted.

## 2016-11-22 ENCOUNTER — Encounter (HOSPITAL_COMMUNITY): Payer: Self-pay | Admitting: Emergency Medicine

## 2016-11-22 ENCOUNTER — Emergency Department (HOSPITAL_COMMUNITY)
Admission: EM | Admit: 2016-11-22 | Discharge: 2016-11-23 | Disposition: A | Discharge status: Home / Self Care | Payer: BLUE CROSS/BLUE SHIELD | Attending: Emergency Medicine | Admitting: Emergency Medicine

## 2016-11-22 DIAGNOSIS — L02811 Cutaneous abscess of head [any part, except face]: Secondary | ICD-10-CM | POA: Insufficient documentation

## 2016-11-22 DIAGNOSIS — L0211 Cutaneous abscess of neck: Secondary | ICD-10-CM | POA: Insufficient documentation

## 2016-11-22 DIAGNOSIS — L0291 Cutaneous abscess, unspecified: Secondary | ICD-10-CM

## 2016-11-22 NOTE — ED Triage Notes (Signed)
Patient complaining of abscess to back of head "for a while."

## 2016-11-23 MED ORDER — DOXYCYCLINE HYCLATE 100 MG PO CAPS: 100.0000 mg | ORAL_CAPSULE | Freq: Two times a day (BID) | ORAL | 0 refills | Status: DC

## 2016-11-23 MED ORDER — ACETAMINOPHEN 325 MG PO TABS
650.0000 mg | ORAL_TABLET | Freq: Once | ORAL | Status: AC
  Administered 2016-11-23: 650 mg via ORAL
  Filled 2016-11-23: qty 2

## 2016-11-23 MED ORDER — DICLOFENAC SODIUM 75 MG PO TBEC: 75.0000 mg | DELAYED_RELEASE_TABLET | Freq: Two times a day (BID) | ORAL | 0 refills | Status: AC

## 2016-11-23 MED ORDER — IBUPROFEN 800 MG PO TABS
800.0000 mg | ORAL_TABLET | Freq: Once | ORAL | Status: AC
  Administered 2016-11-23: 800 mg via ORAL
  Filled 2016-11-23: qty 1

## 2016-11-23 MED ORDER — ONDANSETRON HCL 4 MG PO TABS
4.0000 mg | ORAL_TABLET | Freq: Once | ORAL | Status: AC
  Administered 2016-11-23: 4 mg via ORAL
  Filled 2016-11-23: qty 1

## 2016-11-23 MED ORDER — DOXYCYCLINE HYCLATE 100 MG PO TABS
100.0000 mg | ORAL_TABLET | Freq: Once | ORAL | Status: AC
  Administered 2016-11-23: 100 mg via ORAL
  Filled 2016-11-23: qty 1

## 2016-11-23 MED ORDER — HYDROCODONE-ACETAMINOPHEN 5-325 MG PO TABS: 1.0000 | ORAL_TABLET | ORAL | 0 refills | Status: DC | PRN

## 2016-11-23 NOTE — ED Provider Notes (Signed)
[facility]-EMER[facility]Y DEPT Provider Note   CSN: [id number] Arrival date & time: 11/22/16  1958     History   Chief Complaint Chief Complaint  Patient presents with  . Abscess    HPI Ricardo Oneill is a 46 y.o. male.  Patient states he gets a haircut every week, no more than every 2 weeks on. He states that frequently after he gets his haircut, he trims his [place]ark[place] closer with personal clippers. He states now he has an abscess area on the back of his head that seems to be getting worse and getting larger. He has not had fever or chills to be reported. There's been no drainage from the area up to this point.    Abscess  Location:  Head/neck Head/neck abscess location:  Head Abscess quality: warmth   Abscess quality: not draining   Red streaking: no   Duration:  1 month Progression:  Worsening Chronicity:  New Context: not diabetes and not immunosuppression   Relieved by:  Nothing Worsened by:  Nothing Ineffective treatments:  Warm compresses Associated symptoms: headaches   Associated symptoms: no fever   Risk factors: no prior abscess     History reviewed. No pertinent past medical history.  There are no active problems to display for this patient.   History reviewed. No pertinent surgical history.     Home Medications    Prior to Admission medications   Not on File    Family History History reviewed. No pertinent family history.  Social History Social History  Substance Use Topics  . Smoking status: Never Smoker  . Smokeless tobacco: Never Used  . Alcohol use No     Allergies   Patient has no known allergies.   Review of Systems Review of Systems  Constitutional: Negative for fever.  Skin: Positive for wound.       Scalp/neck area wound-abscess  Neurological: Positive for headaches.  All other systems reviewed and are negative.    Physical Exam Updated Vital Signs BP 129/86 (BP Location: Right Arm)   Pulse 81   Temp 97.6 F (36.4 C)  (Oral)   Resp 18   Ht 6' (1.829 m)   Wt 113.1 kg   SpO2 99%   BMI 33.81 kg/m   Physical Exam  Constitutional: He is oriented to person, place, and time. He appears well-developed and well-nourished.  Non-toxic appearance.  HENT:  Head: Normocephalic.  Right Ear: Tympanic membrane and external ear normal.  Left Ear: Tympanic membrane and external ear normal.  Eyes: EOM and lids are normal. Pupils are equal, round, and reactive to light.  Neck: Normal range of motion. Neck supple. Carotid bruit is not present.  There is a large abscess area that covers almost the entire width of the neck at the base of the scalp that is firm to touch. In the center are 2 pustules present. There is no red streaking going into the scalp on. There is no red streaking going down the neck. The area is warm but not hot. There is no active drainage at this time.  Cardiovascular: Normal rate, regular rhythm, normal heart sounds, intact distal pulses and normal pulses.   Pulmonary/Chest: Breath sounds normal. No respiratory distress.  Abdominal: Soft. Bowel sounds are normal. There is no tenderness. There is no guarding.  Musculoskeletal: Normal range of motion.  Lymphadenopathy:       Head (right side): No submandibular adenopathy present.       Head (left side): No submandibular adenopathy present.  He has no cervical adenopathy.  Neurological: He is alert and oriented to person, place, and time. He has normal strength. No cranial nerve deficit or sensory deficit.  Skin: Skin is warm and dry.  Psychiatric: He has a normal mood and affect. His speech is normal.  Nursing note and vitals reviewed.    ED Treatments / Results  Labs (all labs ordered are listed, but only abnormal results are displayed) Labs Reviewed - No data to display  EKG  EKG Interpretation None       Radiology No results found.  Procedures Procedures (including critical care time)  Medications Ordered in ED Medications - No  data to display   Initial Impression / Assessment and Plan / ED Course  I have reviewed the triage vital signs and the nursing notes.  Pertinent labs & imaging results that were available during my care of the patient were reviewed by me and considered in my medical decision making (see chart for details).     *I have reviewed nursing notes, vital signs, and all appropriate lab and imaging results for this patient.  Final Clinical Impressions(s) / ED Diagnoses MDM Vital signs within normal limits. There is no active drainage from the abscess of the posterior neck/scalp area. This is a large abscess area. And not appropriate for incision and drainage in the emergency department. I discussed these findings with the patient in terms which he understands. The patient will be treated with antibiotics. I've asked him to use warm Epsom salt soaks to the area. I referred him to general surgery for surgical evaluation and management of this issue. Patient had questions about scarring, as well as advancing of his infection. Questions were answered. Fill it is safe for the patient be discharged home with the above instructions and referral.    Final diagnoses:  Abscess    New Prescriptions New Prescriptions   No medications on file     Ivery Quale, PA-C 11/25/16 1918    Lavera Guise, MD 11/26/16 [phone]

## 2016-11-23 NOTE — Discharge Instructions (Addendum)
Please soak the abscess at the back of the head with warm Epsom salt soaks for about 15-20 minutes daily. Use doxycycline and diclofenac 2 times daily with food. Use Norco for pain if needed.This medication may cause drowsiness. Please do not drink, drive, or participate in activity that requires concentration while taking this medication. Please see Dr. Lovell SheehanJenkins for surgical evaluation and management if this is not improving in the next 7-10 days.

## 2017-07-07 IMAGING — DX DG CHEST 2V
2 series · 2 of 2 positions shown · non-contrast
Comparison: None.

CLINICAL DATA: Cough and congestion for 2 days.

EXAM:
CHEST  2 VIEW

[chest pa]
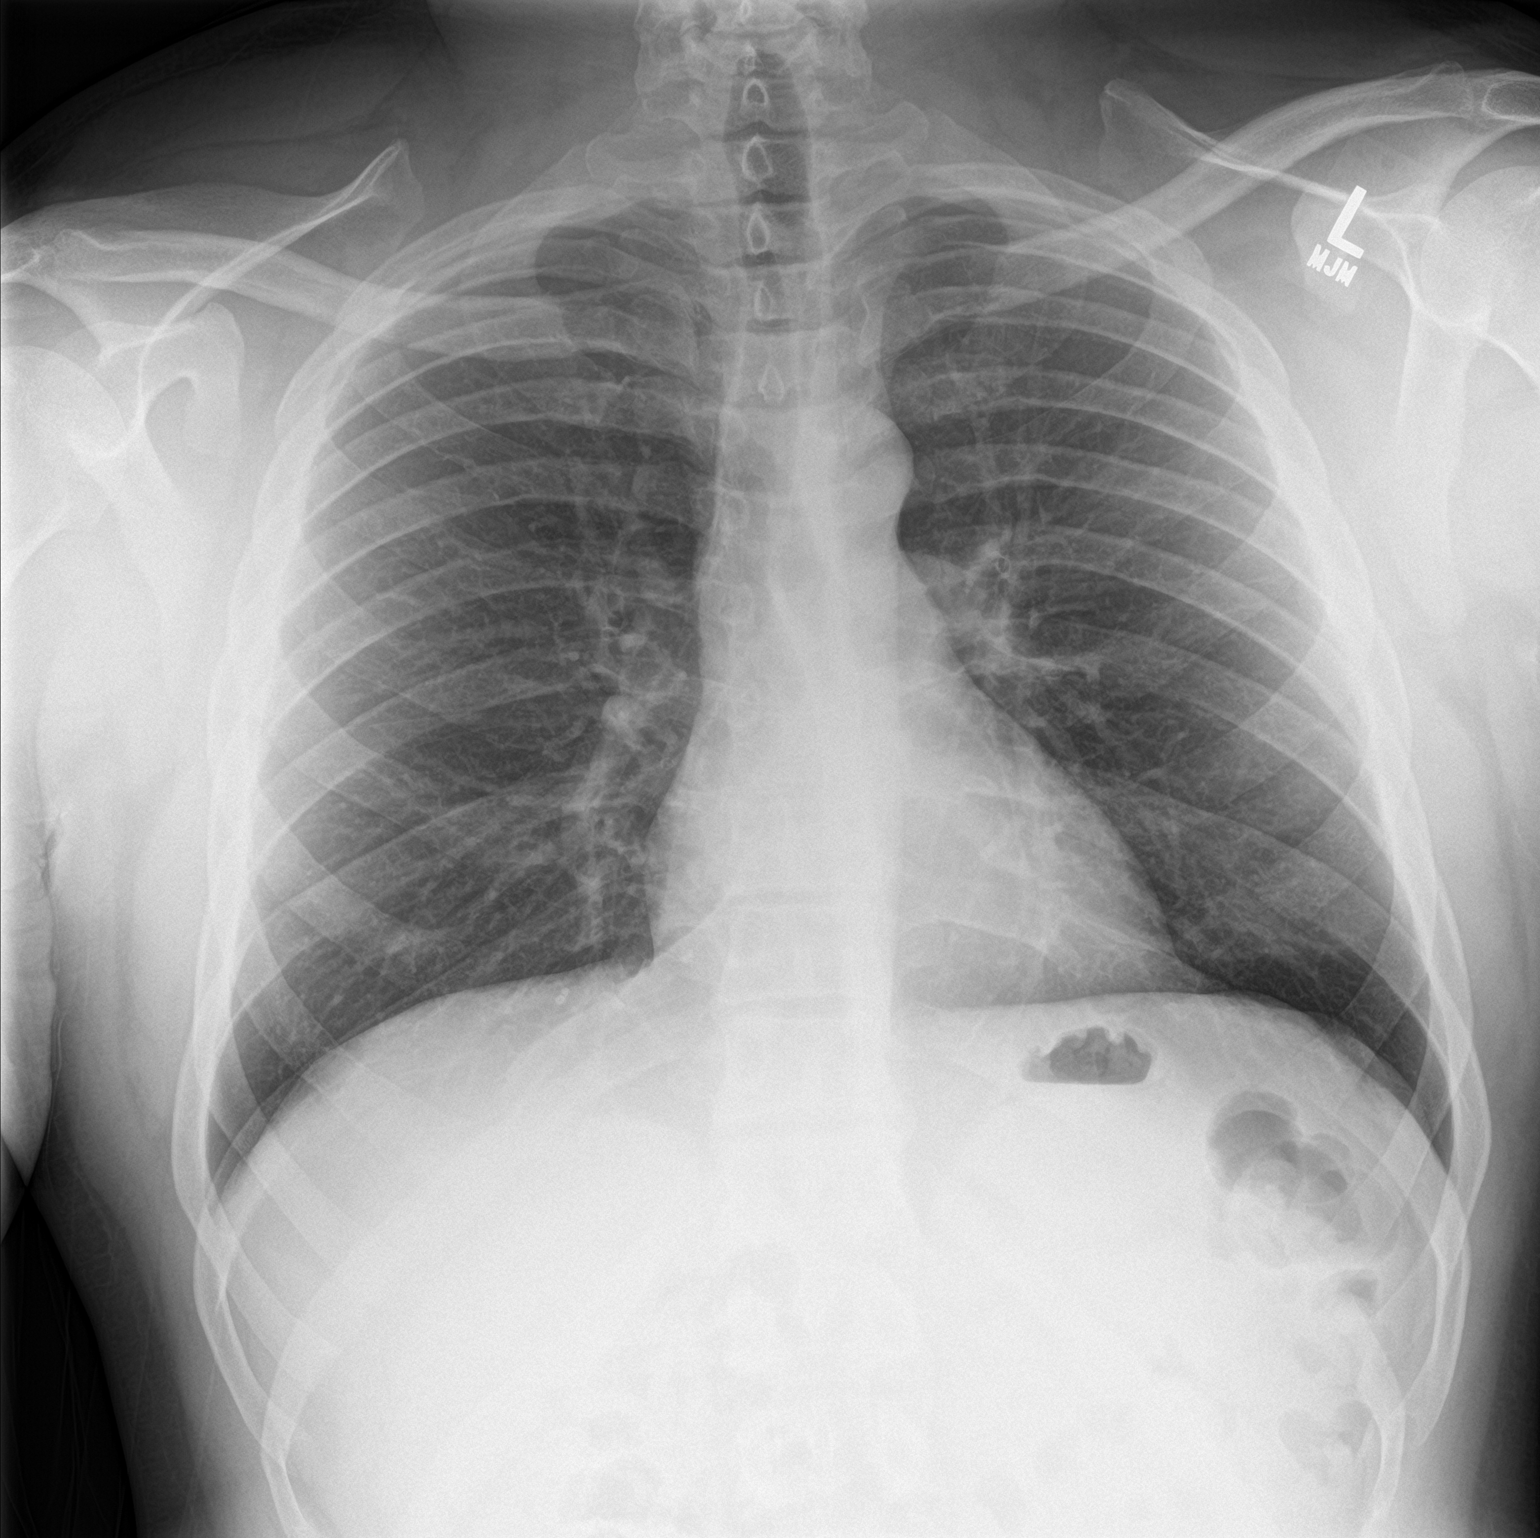

[chest lat]
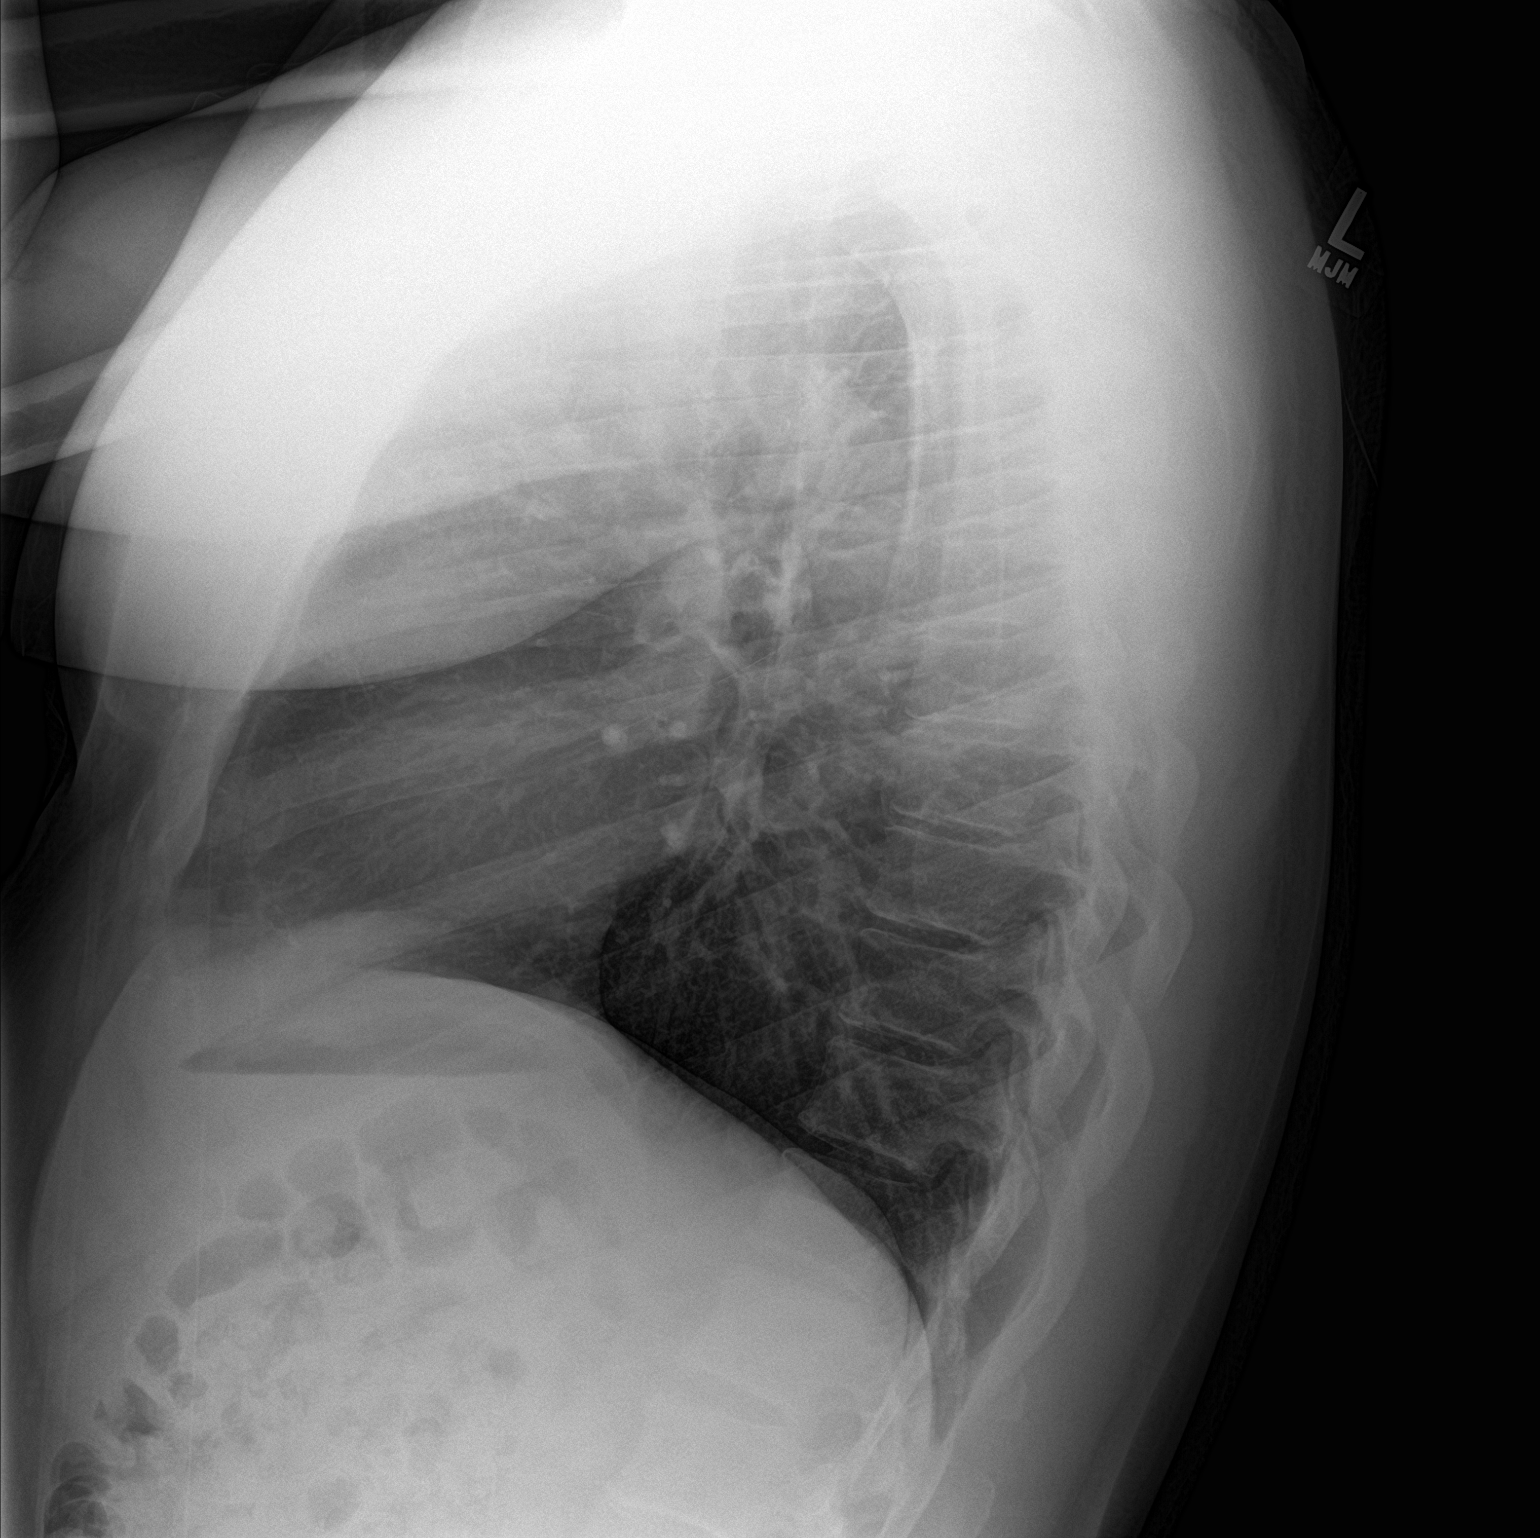

[2 of 2 positions shown; findings below may reference images not displayed]

FINDINGS: The cardiomediastinal contours are normal. The lungs are clear.
Pulmonary vasculature is normal. No consolidation, pleural effusion,
or pneumothorax. No acute osseous abnormalities are seen.
IMPRESSION: No acute pulmonary process.

## 2017-12-31 ENCOUNTER — Emergency Department (HOSPITAL_COMMUNITY)
Admission: EM | Admit: 2017-12-31 | Discharge: 2017-12-31 | Disposition: A | Discharge status: Home / Self Care | Payer: Commercial Managed Care - PPO | Attending: Emergency Medicine | Admitting: Emergency Medicine

## 2017-12-31 ENCOUNTER — Encounter (HOSPITAL_COMMUNITY): Payer: Self-pay | Admitting: Emergency Medicine

## 2017-12-31 ENCOUNTER — Other Ambulatory Visit: Payer: Self-pay

## 2017-12-31 DIAGNOSIS — H6001 Abscess of right external ear: Secondary | ICD-10-CM | POA: Insufficient documentation

## 2017-12-31 DIAGNOSIS — H6122 Impacted cerumen, left ear: Secondary | ICD-10-CM | POA: Diagnosis not present

## 2017-12-31 DIAGNOSIS — Z79899 Other long term (current) drug therapy: Secondary | ICD-10-CM | POA: Diagnosis not present

## 2017-12-31 DIAGNOSIS — H9201 Otalgia, right ear: Secondary | ICD-10-CM | POA: Diagnosis present

## 2017-12-31 MED ORDER — IBUPROFEN 800 MG PO TABS
800.0000 mg | ORAL_TABLET | Freq: Once | ORAL | Status: AC
  Administered 2017-12-31: 800 mg via ORAL
  Filled 2017-12-31: qty 1

## 2017-12-31 MED ORDER — LIDOCAINE HCL (PF) 2 % IJ SOLN: 10.0000 mL | Freq: Once | INTRAMUSCULAR | Status: DC

## 2017-12-31 MED ORDER — IBUPROFEN 600 MG PO TABS: 600.0000 mg | ORAL_TABLET | Freq: Four times a day (QID) | ORAL | 0 refills | Status: DC | PRN

## 2017-12-31 MED ORDER — SULFAMETHOXAZOLE-TRIMETHOPRIM 800-160 MG PO TABS
1.0000 | ORAL_TABLET | Freq: Once | ORAL | Status: AC
Start: 2017-12-31 — End: 2017-12-31
  Administered 2017-12-31: 1 via ORAL
  Filled 2017-12-31: qty 1

## 2017-12-31 MED ORDER — SULFAMETHOXAZOLE-TRIMETHOPRIM 800-160 MG PO TABS
1.0000 | ORAL_TABLET | Freq: Two times a day (BID) | ORAL | 0 refills | Status: AC
Start: 2017-12-31 — End: 2018-01-10

## 2017-12-31 MED ORDER — HYDROGEN PEROXIDE 3 % EX SOLN
CUTANEOUS | Status: AC
  Filled 2017-12-31: qty 473

## 2017-12-31 MED ORDER — LIDOCAINE HCL (PF) 2 % IJ SOLN
INTRAMUSCULAR | Status: AC
  Filled 2017-12-31: qty 10

## 2017-12-31 MED ORDER — HYDROCODONE-ACETAMINOPHEN 5-325 MG PO TABS: 1.0000 | ORAL_TABLET | ORAL | 0 refills | Status: AC | PRN

## 2017-12-31 MED ORDER — POVIDONE-IODINE 10 % EX SOLN
CUTANEOUS | Status: AC
  Filled 2017-12-31: qty 15

## 2017-12-31 NOTE — ED Notes (Signed)
Pt ambulatory to waiting room. Pt verbalized understanding of discharge instructions.   

## 2017-12-31 NOTE — Discharge Instructions (Addendum)
Apply warm compresses to your right ear to keep this infection site opened up and draining.  Do this 2-3 times daily using a warm wash cloth.  Take the entire course of the antibiotics prescribed.  You may take the hydrocodone prescribed for pain relief.  This will make you drowsy - do not drive within 4 hours of taking this medication.

## 2017-12-31 NOTE — ED Triage Notes (Addendum)
Patient complaining of right ear pain x 3-4 days. Patient has abscess noted to outside of right ear.

## 2018-01-01 NOTE — ED Provider Notes (Signed)
[facility] Provider Note   CSN: [id number] Arrival date & time: 12/31/17  1809     History   Chief Complaint Chief Complaint  Patient presents with  . Otalgia    HPI Ricardo Oneill is a 47 y.o. male.  The history is provided by the patient.  Otalgia  This is a new problem. Episode onset: 4 days. There is pain in the right ear. The problem occurs constantly. The problem has not changed since onset.There has been no fever. The pain is at a severity of 5/10. The pain is moderate. Associated symptoms include hearing loss. Pertinent negatives include no ear discharge, no rhinorrhea, no sore throat, no abdominal pain and no cough. Associated symptoms comments: Reports decreased hearing acuity left ear. His past medical history does not include chronic ear infection or hearing loss.    History reviewed. No pertinent past medical history.  There are no active problems to display for this patient.   History reviewed. No pertinent surgical history.      Home Medications    Prior to Admission medications   Medication Sig Start Date End Date Taking? Authorizing Provider  diclofenac (VOLTAREN) 75 MG EC tablet Take 1 tablet (75 mg total) by mouth 2 (two) times daily. 11/23/16   Ivery QualeBryant, Hobson, PA-C  doxycycline (VIBRAMYCIN) 100 MG capsule Take 1 capsule (100 mg total) by mouth 2 (two) times daily. 11/23/16   Ivery QualeBryant, Hobson, PA-C  HYDROcodone-acetaminophen (NORCO/VICODIN) 5-325 MG tablet Take 1 tablet by mouth every 4 (four) hours as needed for severe pain. 12/31/17   Burgess AmorIdol, Analeia Ismael, PA-C  ibuprofen (ADVIL,MOTRIN) 600 MG tablet Take 1 tablet (600 mg total) by mouth every 6 (six) hours as needed for moderate pain. 12/31/17   Burgess AmorIdol, Tomoki Lucken, PA-C  sulfamethoxazole-trimethoprim (BACTRIM DS,SEPTRA DS) 800-160 MG tablet Take 1 tablet by mouth 2 (two) times daily for 10 days. 12/31/17 01/10/18  Burgess AmorIdol, Leonte Horrigan, PA-C    Family History History reviewed. No pertinent family  history.  Social History Social History   Tobacco Use  . Smoking status: Never Smoker  . Smokeless tobacco: Never Used  Substance Use Topics  . Alcohol use: No  . Drug use: No     Allergies   Patient has no known allergies.   Review of Systems Review of Systems  Constitutional: Negative for chills and fever.  HENT: Positive for ear pain and hearing loss. Negative for congestion, ear discharge, rhinorrhea, sinus pressure, sore throat, trouble swallowing and voice change.   Eyes: Negative for discharge.  Respiratory: Negative for cough, shortness of breath, wheezing and stridor.   Cardiovascular: Negative for chest pain.  Gastrointestinal: Negative for abdominal pain.  Genitourinary: Negative.      Physical Exam Updated Vital Signs BP 121/89 (BP Location: Right Arm)   Pulse 79   Temp 98.1 F (36.7 C) (Oral)   Resp 16   Ht 6' (1.829 m)   Wt 121.3 kg (267 lb 7 oz)   SpO2 97%   BMI 36.27 kg/m   Physical Exam  Constitutional: He is oriented to person, place, and time. He appears well-developed and well-nourished.  HENT:  Head: Normocephalic and atraumatic.  Right Ear: Tympanic membrane and ear canal normal.  Left Ear: Tympanic membrane and ear canal normal.  Nose: Mucosal edema and rhinorrhea present.  Mouth/Throat: Uvula is midline, oropharynx is clear and moist and mucous membranes are normal. No oropharyngeal exudate, posterior oropharyngeal edema, posterior oropharyngeal erythema or tonsillar abscesses.  Cerumen impaction left canal.  Induration localized to the right tragus with small nondraining punctum.  No spreading ear or facial erythema.  Eyes: Conjunctivae are normal.  Cardiovascular: Normal rate and normal heart sounds.  Pulmonary/Chest: Effort normal. No respiratory distress. He has no wheezes.  Musculoskeletal: Normal range of motion.  Neurological: He is alert and oriented to person, place, and time.  Skin: Skin is warm and dry. No rash noted.   Psychiatric: He has a normal mood and affect.     ED Treatments / Results  Labs (all labs ordered are listed, but only abnormal results are displayed) Labs Reviewed - No data to display  EKG None  Radiology No results found.  Procedures .Ear Cerumen Removal Date/Time: 12/31/2017 8:35 PM Performed by: Burgess Amor, PA-C Authorized by: Burgess Amor, PA-C   Consent:    Consent obtained:  Verbal   Consent given by:  Patient   Risks discussed:  Pain, incomplete removal and dizziness   Alternatives discussed:  No treatment Procedure details:    Location:  L ear   Procedure type: irrigation   Post-procedure details:    Inspection:  TM intact   Hearing quality:  Improved   Patient tolerance of procedure:  Tolerated well, no immediate complications Comments:     Large amount of cerumen removed with irrigation of left ear.   (including critical care time)  INCISION AND DRAINAGE Performed by: Burgess Amor Consent: Verbal consent obtained. Risks and benefits: risks, benefits and alternatives were discussed Type: abscess  Body area: right ear tragus  Anesthesia: local infiltration  Small stab Incision was made with a scalpel.  Local anesthetic: lidocaine 2% without epinephrine  Anesthetic total: 0.5 ml  Complexity: complex Blunt dissection to break up loculations  Drainage: purulent  Drainage amount: small  Packing material: none  Patient tolerance: Patient tolerated the procedure well with no immediate complications.        Medications Ordered in ED Medications  ibuprofen (ADVIL,MOTRIN) tablet 800 mg (800 mg Oral Given 12/31/17 2049)  sulfamethoxazole-trimethoprim (BACTRIM DS,SEPTRA DS) 800-160 MG per tablet 1 tablet (1 tablet Oral Given 12/31/17 2049)     Initial Impression / Assessment and Plan / ED Course  I have reviewed the triage vital signs and the nursing notes.  Pertinent labs & imaging results that were available during my care of the patient  were reviewed by me and considered in my medical decision making (see chart for details).     Pt with cerumen impaction, resolved without complication.  He also has a small abscess at the right tragus which was I&D'd. Discussed warm compresses to the site, bactrim. Close f/u here or with pcp if sx persist or for any worsening sx including pain spreading infection or new sx.   Final Clinical Impressions(s) / ED Diagnoses   Final diagnoses:  Abscess of tragus of right ear  Impacted cerumen of left ear    ED Discharge Orders        Ordered    sulfamethoxazole-trimethoprim (BACTRIM DS,SEPTRA DS) 800-160 MG tablet  2 times daily     12/31/17 1959    ibuprofen (ADVIL,MOTRIN) 600 MG tablet  Every 6 hours PRN     12/31/17 1959    HYDROcodone-acetaminophen (NORCO/VICODIN) 5-325 MG tablet  Every 4 hours PRN     12/31/17 1959       Burgess Amor, PA-C 01/01/18 2212    Linwood Dibbles, MD 01/09/18 [phone]

## 2018-04-09 ENCOUNTER — Emergency Department (HOSPITAL_COMMUNITY)
Admission: EM | Admit: 2018-04-09 | Discharge: 2018-04-09 | Disposition: A | Discharge status: Home / Self Care | Payer: Commercial Managed Care - PPO

## 2018-04-10 ENCOUNTER — Other Ambulatory Visit: Payer: Self-pay

## 2018-04-10 ENCOUNTER — Encounter (HOSPITAL_COMMUNITY): Payer: Self-pay | Admitting: Emergency Medicine

## 2018-04-10 ENCOUNTER — Emergency Department (HOSPITAL_COMMUNITY)
Admission: EM | Admit: 2018-04-10 | Discharge: 2018-04-10 | Disposition: A | Discharge status: Home / Self Care | Payer: Commercial Managed Care - PPO | Attending: Emergency Medicine | Admitting: Emergency Medicine

## 2018-04-10 DIAGNOSIS — H9202 Otalgia, left ear: Secondary | ICD-10-CM | POA: Diagnosis not present

## 2018-04-10 DIAGNOSIS — Y929 Unspecified place or not applicable: Secondary | ICD-10-CM | POA: Diagnosis not present

## 2018-04-10 DIAGNOSIS — Y9389 Activity, other specified: Secondary | ICD-10-CM | POA: Diagnosis not present

## 2018-04-10 DIAGNOSIS — W268XXA Contact with other sharp object(s), not elsewhere classified, initial encounter: Secondary | ICD-10-CM | POA: Diagnosis not present

## 2018-04-10 DIAGNOSIS — S01312A Laceration without foreign body of left ear, initial encounter: Secondary | ICD-10-CM | POA: Diagnosis not present

## 2018-04-10 DIAGNOSIS — Y999 Unspecified external cause status: Secondary | ICD-10-CM | POA: Insufficient documentation

## 2018-04-10 MED ORDER — ONDANSETRON HCL 4 MG PO TABS
4.0000 mg | ORAL_TABLET | Freq: Once | ORAL | Status: AC
  Administered 2018-04-10: 4 mg via ORAL
  Filled 2018-04-10: qty 1

## 2018-04-10 MED ORDER — CEPHALEXIN 500 MG PO CAPS
500.0000 mg | ORAL_CAPSULE | Freq: Once | ORAL | Status: AC
  Administered 2018-04-10: 500 mg via ORAL
  Filled 2018-04-10: qty 1

## 2018-04-10 MED ORDER — IBUPROFEN 600 MG PO TABS: 600.0000 mg | ORAL_TABLET | Freq: Four times a day (QID) | ORAL | 0 refills | Status: AC

## 2018-04-10 MED ORDER — CEPHALEXIN 500 MG PO CAPS: 500.0000 mg | ORAL_CAPSULE | Freq: Four times a day (QID) | ORAL | 0 refills | Status: DC

## 2018-04-10 MED ORDER — IBUPROFEN 800 MG PO TABS
800.0000 mg | ORAL_TABLET | Freq: Once | ORAL | Status: AC
Start: 2018-04-10 — End: 2018-04-10
  Administered 2018-04-10: 800 mg via ORAL
  Filled 2018-04-10: qty 1

## 2018-04-10 NOTE — Discharge Instructions (Addendum)
You have some mild redness of the opening of your right ear canal.  You have a scratch/laceration of the left canal.  No significant wax buildup noted.  Fortunately you did not damage the eardrum.  Please use Keflex and ibuprofen with breakfast, lunch, dinner, and at bedtime.  [facility] if any additional changes or problems.  Please do not put anything in your ears other than your wash cough.

## 2018-04-10 NOTE — ED Provider Notes (Signed)
[facility] Provider [facility]   CSN: [id number] Arrival date & time: 04/10/18  1702     History   Chief Complaint Chief Complaint  Patient presents with  . Otalgia    HPI Ricardo Oneill is a 47 y.o. male.  Patient states he put a pin in his ear on the left to remove wax.  He now has pain in that area.  He is requesting to have his ears flushed.  He has not had any pus like drainage, he has not had any bloody drainage, there is been no fever or chills reported.  The history is provided by the patient.  Otalgia  This is a new problem. The current episode started more than 2 days ago. There is pain in both ears. The problem occurs daily. The problem has been gradually worsening. There has been no fever. The pain is moderate. Pertinent negatives include no ear discharge, no headaches, no hearing loss, no abdominal pain, no neck pain, no cough and no rash. His past medical history does not include chronic ear infection or hearing loss.    History reviewed. No pertinent past medical history.  There are no active problems to display for this patient.   History reviewed. No pertinent surgical history.      Home Medications    Prior to Admission medications   Medication Sig Start Date End Date Taking? Authorizing Provider  diclofenac (VOLTAREN) 75 MG EC tablet Take 1 tablet (75 mg total) by mouth 2 (two) times daily. 11/23/16   Ivery QualeBryant, Amarion Portell, PA-C  doxycycline (VIBRAMYCIN) 100 MG capsule Take 1 capsule (100 mg total) by mouth 2 (two) times daily. 11/23/16   Ivery QualeBryant, Gerda Yin, PA-C  HYDROcodone-acetaminophen (NORCO/VICODIN) 5-325 MG tablet Take 1 tablet by mouth every 4 (four) hours as needed for severe pain. 12/31/17   Burgess AmorIdol, Julie, PA-C  ibuprofen (ADVIL,MOTRIN) 600 MG tablet Take 1 tablet (600 mg total) by mouth every 6 (six) hours as needed for moderate pain. 12/31/17   Burgess AmorIdol, Julie, PA-C    Family History History reviewed. No pertinent family history.  Social  History Social History   Tobacco Use  . Smoking status: Never Smoker  . Smokeless tobacco: Never Used  Substance Use Topics  . Alcohol use: No  . Drug use: No     Allergies   Patient has no known allergies.   Review of Systems Review of Systems  Constitutional: Negative for activity change.       All ROS Neg except as noted in HPI  HENT: Positive for ear pain. Negative for ear discharge, hearing loss and nosebleeds.   Eyes: Negative for photophobia and discharge.  Respiratory: Negative for cough, shortness of breath and wheezing.   Cardiovascular: Negative for chest pain and palpitations.  Gastrointestinal: Negative for abdominal pain and blood in stool.  Genitourinary: Negative for dysuria, frequency and hematuria.  Musculoskeletal: Negative for arthralgias, back pain and neck pain.  Skin: Negative.  Negative for rash.  Neurological: Negative for dizziness, seizures, speech difficulty and headaches.  Psychiatric/Behavioral: Negative for confusion and hallucinations.     Physical Exam Updated Vital Signs BP 128/88 (BP Location: Right Arm)   Pulse 68   Temp 98.1 F (36.7 C) (Oral)   Resp 18   Ht 6' (1.829 m)   Wt 115.9 kg (255 lb 9 oz)   SpO2 99%   BMI 34.66 kg/m   Physical Exam  Constitutional: He is oriented to person, place, and time. He appears well-developed and  well-nourished.  Non-toxic appearance.  HENT:  Head: Normocephalic.  Right Ear: Tympanic membrane and external ear normal.  Left Ear: Tympanic membrane and external ear normal.  There is no pre-or postauricular nodes noted on the right or the left.  The external ear is within normal limits bilaterally.  The right external auditory canal shows minimal redness present.  There is a small amount of wax present.  The tympanic membrane is within normal limits, there is no bulging and no increased redness noted.  There is no fluid behind the drum.  The left external auditory canal shows to scratch areas.   The tympanic membrane is certainly within normal limits.  There is no fluid behind the drum.  There is increased redness of the external auditory canal.  The external ear appears to be normal.  Eyes: Pupils are equal, round, and reactive to light. EOM and lids are normal.  Neck: Normal range of motion. Neck supple. Carotid bruit is not present.  Cardiovascular: Normal rate, regular rhythm, normal heart sounds, intact distal pulses and normal pulses.  Pulmonary/Chest: Breath sounds normal. No respiratory distress.  Abdominal: Soft. Bowel sounds are normal. There is no tenderness. There is no guarding.  Musculoskeletal: Normal range of motion.  Lymphadenopathy:       Head (right side): No submandibular adenopathy present.       Head (left side): No submandibular adenopathy present.    He has no cervical adenopathy.  Neurological: He is alert and oriented to person, place, and time. He has normal strength. No cranial nerve deficit or sensory deficit.  Skin: Skin is warm and dry.  Psychiatric: He has a normal mood and affect. His speech is normal.  Nursing note and vitals reviewed.    ED Treatments / Results  Labs (all labs ordered are listed, but only abnormal results are displayed) Labs Reviewed - No data to display  EKG None  Radiology No results found.  Procedures Procedures (including critical care time)  Medications Ordered in ED Medications - No data to display   Initial Impression / Assessment and Plan / ED Course  I have reviewed the triage vital signs and the nursing notes.  Pertinent labs & imaging results that were available during my care of the patient were reviewed by me and considered in my medical decision making (see chart for details).       Final Clinical Impressions(s) / ED Diagnoses MDM No pus like or bloody drainage from the ears.  No pre-or postauricular nodes noted.  Patient has been putting objects including pins in his ears to attempt to clean out  the wax, no other trauma to the ears.  vital signs are within normal limits. some increased redness of the right external canal.  2 scratch/laceration areas in the left external canal. no damage the eardrum on the right or the left.  Keflex and ibuprofen with breakfast, lunch, dinner, and at hs. I have advised the patient to keep all objects out of his ear with exception of his wash cough.  Patient is to follow-up with the physicians at the [facility] if any other changes, problems, or concerns.   Final diagnoses:  Otalgia of left ear  Laceration of left external auditory canal, initial encounter    ED Discharge Orders        Ordered    cephALEXin (KEFLEX) 500 MG capsule  4 times daily     04/10/18 1920    ibuprofen (ADVIL,MOTRIN) 600 MG tablet  4  times daily     04/10/18 1920       Ivery Quale, PA-C 04/10/18 1930    Donnetta Hutching, MD 04/11/18 [phone]

## 2018-04-10 NOTE — ED Triage Notes (Signed)
PT c/o bilateral ear fullness and left ear pain x3 days.

## 2018-09-16 ENCOUNTER — Encounter (HOSPITAL_COMMUNITY): Payer: Self-pay

## 2018-09-16 ENCOUNTER — Emergency Department (HOSPITAL_COMMUNITY)
Admission: EM | Admit: 2018-09-16 | Discharge: 2018-09-16 | Disposition: A | Discharge status: Home / Self Care | Payer: Commercial Managed Care - PPO | Attending: Emergency Medicine | Admitting: Emergency Medicine

## 2018-09-16 DIAGNOSIS — Z79899 Other long term (current) drug therapy: Secondary | ICD-10-CM | POA: Insufficient documentation

## 2018-09-16 DIAGNOSIS — H6121 Impacted cerumen, right ear: Secondary | ICD-10-CM | POA: Insufficient documentation

## 2018-09-16 DIAGNOSIS — H9391 Unspecified disorder of right ear: Secondary | ICD-10-CM | POA: Diagnosis present

## 2018-09-16 MED ORDER — HYDROGEN PEROXIDE 3 % EX SOLN
CUTANEOUS | Status: AC
  Filled 2018-09-16: qty 473

## 2018-09-16 MED ORDER — DOCUSATE SODIUM 50 MG/5ML PO LIQD
50.0000 mg | Freq: Once | ORAL | Status: AC
  Administered 2018-09-16: 50 mg via OTIC
  Filled 2018-09-16: qty 10

## 2018-09-16 NOTE — Discharge Instructions (Signed)
You can put mineral oil (warmed), liquid colace or peroxide mixed in water, in your ears are a weekly basis to help prevent buildup of wax.

## 2018-09-16 NOTE — ED Provider Notes (Signed)
[facility] EMERGENCY DEPARTMENT Provider Note   CSN: [id number] Arrival date & time: 09/16/18  0015  Time seen 1:15 AM   History   Chief Complaint Chief Complaint  Patient presents with  . 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 male.  HPI patient states he feels like his right ear is "stopped up" for a few days.  He denies any pain or loss of hearing.  This is happened before.  He has done nothing to alleviate his symptoms.  He has had this before.  [facility]   History reviewed. No pertinent past medical history.  There are no active problems to display for this patient.   History reviewed. No pertinent surgical history.      Home Medications    Prior to Admission medications   Medication Sig Start Date End Date Taking? Authorizing Provider  cephALEXin (KEFLEX) 500 MG capsule Take 1 capsule (500 mg total) by mouth 4 (four) times daily. 04/10/18   Ivery QualeBryant, Hobson, PA-C  diclofenac (VOLTAREN) 75 MG EC tablet Take 1 tablet (75 mg total) by mouth 2 (two) times daily. 11/23/16   Ivery QualeBryant, Hobson, PA-C  doxycycline (VIBRAMYCIN) 100 MG capsule Take 1 capsule (100 mg total) by mouth 2 (two) times daily. 11/23/16   Ivery QualeBryant, Hobson, PA-C  HYDROcodone-acetaminophen (NORCO/VICODIN) 5-325 MG tablet Take 1 tablet by mouth every 4 (four) hours as needed for severe pain. 12/31/17   Burgess AmorIdol, Julie, PA-C  ibuprofen (ADVIL,MOTRIN) 600 MG tablet Take 1 tablet (600 mg total) by mouth 4 (four) times daily. 04/10/18   Ivery QualeBryant, Hobson, PA-C    Family History No family history on file.  Social History Social History   Tobacco Use  . Smoking status: Never Smoker  . Smokeless tobacco: Never Used  Substance Use Topics  . Alcohol use: No  . Drug use: No  employed as a Biochemist, clinicaldetention officer   Allergies   Patient has no known allergies.   Review of Systems Review of Systems  All other systems reviewed and are negative.    Physical Exam Updated Vital  Signs BP 138/69 (BP Location: Left Arm)   Pulse 88   Temp 97.8 F (36.6 C) (Oral)   Resp 18   Ht 6' (1.829 m)   Wt 117.9 kg   SpO2 100%   BMI 35.26 kg/m   Physical Exam Vitals signs and nursing note reviewed.  Constitutional:      General: He is not in acute distress.    Appearance: Normal appearance. He is ill-appearing.  HENT:     Head: Normocephalic and atraumatic.     Comments: No pain to tragal pulling on either side    Right Ear: External ear normal. There is impacted cerumen.     Left Ear: Tympanic membrane, ear canal and external ear normal. There is no impacted cerumen.     Nose: Nose normal.  Eyes:     Extraocular Movements: Extraocular movements intact.     Conjunctiva/sclera: Conjunctivae normal.     Pupils: Pupils are equal, round, and reactive to light.  Neck:     Musculoskeletal: Normal range of motion.  Cardiovascular:     Rate and Rhythm: Normal rate.  Pulmonary:     Effort: Pulmonary effort is normal. No respiratory distress.  Musculoskeletal: Normal range of motion.  Skin:    General: Skin is warm and dry.     Findings: No rash.  Neurological:     General: No  focal deficit present.     Mental Status: He is alert and oriented to person, place, and time.  Psychiatric:        Mood and Affect: Mood normal.        Behavior: Behavior normal.        Thought Content: Thought content normal.      ED Treatments / Results  Labs (all labs ordered are listed, but only abnormal results are displayed) Labs Reviewed - No data to display  EKG None  Radiology No results found.  Procedures Procedures (including critical care time)  Medications Ordered in ED Medications  hydrogen peroxide 3 % external solution (has no administration in time range)  docusate (COLACE) 50 MG/5ML liquid 50 mg (50 mg Right EAR Given 09/16/18 0200)     Initial Impression / Assessment and Plan / ED Course  I have reviewed the triage vital signs and the nursing  notes.  Pertinent labs & imaging results that were available during my care of the patient were reviewed by me and considered in my medical decision making (see chart for details).     Patient had Colace placed in his ear canal and he was flushed with warm peroxide water about 20 minutes later.  Nurse reports a large cerumen plug came out of his ear now.  2:35 AM patient's right ear canal is now totally clean.  TM appears normal.  Final Clinical Impressions(s) / ED Diagnoses   Final diagnoses:  Impacted cerumen of right ear    ED Discharge Orders    None      Plan discharge  Devoria Albe, MD, Concha Pyo, MD 09/16/18 [phone]

## 2018-09-16 NOTE — ED Triage Notes (Signed)
Pt states he feels like his ears are plugged with wax, has had this problem before.

## 2020-11-07 ENCOUNTER — Ambulatory Visit
Admission: EM | Admit: 2020-11-07 | Discharge: 2020-11-07 | Disposition: A | Discharge status: Home / Self Care | Payer: Commercial Managed Care - PPO | Attending: Family Medicine | Admitting: Family Medicine

## 2020-11-07 ENCOUNTER — Other Ambulatory Visit: Payer: Self-pay

## 2020-11-07 ENCOUNTER — Encounter: Payer: Self-pay | Admitting: Emergency Medicine

## 2020-11-07 DIAGNOSIS — Z760 Encounter for issue of repeat prescription: Secondary | ICD-10-CM

## 2020-11-07 HISTORY — DX: Type 2 diabetes mellitus without complications: E11.9

## 2020-11-07 MED ORDER — GLIPIZIDE-METFORMIN HCL 5-500 MG PO TABS: 1.0000 | ORAL_TABLET | Freq: Two times a day (BID) | ORAL | 0 refills | Status: DC

## 2020-11-07 NOTE — ED Triage Notes (Signed)
Needs refill on glipizide-metformin 5mg -500mg  bid.  Pt attempted to call pcp office to make appointment but they were close.

## 2020-11-07 NOTE — Discharge Instructions (Signed)
Refilled glipizide-metformin 5-500 twice a day every day  Follow up with this office or with primary care if symptoms are persisting.  Follow up in the ER for high fever, trouble swallowing, trouble breathing, other concerning symptoms.

## 2020-11-07 NOTE — ED Provider Notes (Signed)
[facility]    CSN: [id number] Arrival date & time: 11/07/20  1742      History   Chief Complaint Chief Complaint  Patient presents with  . Medication Refill    HPI Ricardo Oneill is a 50 y.o. male.   Reports that he is here for medication refills. [facility] He is needing glipizide-metformin 5-500 refilled. States that he has only been taking one tablet daily. He has the bottle with him and it states to take BID. Patient states that he likes to make his medication last as long as he can.  ROS per HPI  The history is provided by the patient.    Past Medical History:  Diagnosis Date  . Diabetes mellitus without complication (HCC)     There are no problems to display for this patient.   History reviewed. No pertinent surgical history.     Home Medications    Prior to Admission medications   Medication Sig Start Date End Date Taking? Authorizing Provider  glipiZIDE-metformin (METAGLIP) 5-500 MG tablet Take 1 tablet by mouth 2 (two) times daily before a meal. 11/07/20 12/07/20 Yes Moshe Cipro, NP  cephALEXin (KEFLEX) 500 MG capsule Take 1 capsule (500 mg total) by mouth 4 (four) times daily. 04/10/18   Ivery Quale, PA-C  diclofenac (VOLTAREN) 75 MG EC tablet Take 1 tablet (75 mg total) by mouth 2 (two) times daily. 11/23/16   Ivery Quale, PA-C  doxycycline (VIBRAMYCIN) 100 MG capsule Take 1 capsule (100 mg total) by mouth 2 (two) times daily. 11/23/16   Ivery Quale, PA-C  HYDROcodone-acetaminophen (NORCO/VICODIN) 5-325 MG tablet Take 1 tablet by mouth every 4 (four) hours as needed for severe pain. 12/31/17   Burgess Amor, PA-C  ibuprofen (ADVIL,MOTRIN) 600 MG tablet Take 1 tablet (600 mg total) by mouth 4 (four) times daily. 04/10/18   Ivery Quale, PA-C    Family History No family history on file.  Social History Social History   Tobacco Use  . Smoking status: Never Smoker  . Smokeless tobacco: Never Used  Vaping Use  .  Vaping Use: Never used  Substance Use Topics  . Alcohol use: No  . Drug use: No     Allergies   Patient has no known allergies.   Review of Systems Review of Systems   Physical Exam Triage Vital Signs ED Triage Vitals  Enc Vitals Group     BP      Pulse      Resp      Temp      Temp src      SpO2      Weight      Height      Head Circumference      Peak Flow      Pain Score      Pain Loc      Pain Edu?      Excl. in GC?    No data found.  Updated Vital Signs BP 120/77 (BP Location: Right Arm)   Pulse 73   Temp 98.1 F (36.7 C) (Oral)   Resp 18   Ht 6' (1.829 m)   Wt 259 lb 14.8 oz (117.9 kg)   SpO2 98%   BMI 35.25 kg/m   Physical Exam Vitals and nursing note reviewed.  Constitutional:      General: He is not in acute distress.    Appearance: Normal appearance. He is well-developed and well-nourished. He is not  ill-appearing.  HENT:     Head: Normocephalic and atraumatic.     Nose: Nose normal.     Mouth/Throat:     Mouth: Mucous membranes are moist.     Pharynx: Oropharynx is clear.  Eyes:     Extraocular Movements: Extraocular movements intact.     Conjunctiva/sclera: Conjunctivae normal.     Pupils: Pupils are equal, round, and reactive to light.  Cardiovascular:     Rate and Rhythm: Normal rate and regular rhythm.     Heart sounds: Normal heart sounds. No murmur heard.   Pulmonary:     Effort: Pulmonary effort is normal. No respiratory distress.     Breath sounds: Normal breath sounds.  Abdominal:     Palpations: Abdomen is soft.     Tenderness: There is no abdominal tenderness.  Musculoskeletal:        General: No edema. Normal range of motion.     Cervical back: Normal range of motion and neck supple.  Skin:    General: Skin is warm and dry.     Capillary Refill: Capillary refill takes less than 2 seconds.  Neurological:     General: No focal deficit present.     Mental Status: He is alert and oriented to person, place, and time.   Psychiatric:        Mood and Affect: Mood and affect and mood normal.        Behavior: Behavior normal.        Thought Content: Thought content normal.      UC Treatments / Results  Labs (all labs ordered are listed, but only abnormal results are displayed) Labs Reviewed - No data to display  EKG   Radiology No results found.  Procedures Procedures (including critical care time)  Medications Ordered in UC Medications - No data to display  Initial Impression / Assessment and Plan / UC Course  I have reviewed the triage vital signs and the nursing notes.  Pertinent labs & imaging results that were available during my care of the patient were reviewed by me and considered in my medical decision making (see chart for details).    Medication Refill  Refilled glipizide-metformin 5-500mg  BID  Discussed taking this medication as prescribed to receive maximum benefit Verbalized understanding Agrees with treatment plan  Final Clinical Impressions(s) / UC Diagnoses   Final diagnoses:  Medication refill     Discharge Instructions     Refilled glipizide-metformin 5-500 twice a day every day  Follow up with this office or with primary care if symptoms are persisting.  Follow up in the ER for high fever, trouble swallowing, trouble breathing, other concerning symptoms.     ED Prescriptions    Medication Sig Dispense Auth. Provider   glipiZIDE-metformin (METAGLIP) 5-500 MG tablet Take 1 tablet by mouth 2 (two) times daily before a meal. 60 tablet Moshe Cipro, NP     PDMP not reviewed this encounter.   Moshe Cipro, NP 11/07/20 1821

## 2021-01-28 ENCOUNTER — Ambulatory Visit
Admission: EM | Admit: 2021-01-28 | Discharge: 2021-01-28 | Disposition: A | Discharge status: Home / Self Care | Payer: Commercial Managed Care - PPO | Attending: Family Medicine | Admitting: Family Medicine

## 2021-01-28 ENCOUNTER — Other Ambulatory Visit: Payer: Self-pay

## 2021-01-28 DIAGNOSIS — W503XXA Accidental bite by another person, initial encounter: Secondary | ICD-10-CM | POA: Diagnosis not present

## 2021-01-28 DIAGNOSIS — S61112A Laceration without foreign body of left thumb with damage to nail, initial encounter: Secondary | ICD-10-CM

## 2021-01-28 DIAGNOSIS — E119 Type 2 diabetes mellitus without complications: Secondary | ICD-10-CM

## 2021-01-28 DIAGNOSIS — S61052A Open bite of left thumb without damage to nail, initial encounter: Secondary | ICD-10-CM

## 2021-01-28 MED ORDER — TETANUS-DIPHTH-ACELL PERTUSSIS 5-2.5-18.5 LF-MCG/0.5 IM SUSY
0.5000 mL | PREFILLED_SYRINGE | Freq: Once | INTRAMUSCULAR | Status: AC
  Administered 2021-01-28: 0.5 mL via INTRAMUSCULAR

## 2021-01-28 MED ORDER — AMOXICILLIN-POT CLAVULANATE 875-125 MG PO TABS: 1.0000 | ORAL_TABLET | Freq: Two times a day (BID) | ORAL | 0 refills | Status: AC

## 2021-01-28 NOTE — Discharge Instructions (Signed)
Your labs will result within 72 hours. If all labs are normal no one from our clinic will contact you. Complete all medication and monitor for signs of infection. If any swelling or drainage develops at the site of the bite wound, return for evaluation.

## 2021-01-28 NOTE — ED Provider Notes (Signed)
[facility]    CSN: [id number] Arrival date & time: 01/28/21  1826      History   Chief Complaint Chief Complaint  Patient presents with  . Finger Injury    HPI Ricardo Oneill is a 50 y.o. male.   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 He is a diabetic. He is uncertain of his last tetanus or the medical history of the inmate the bit him. He requests labs to rule out any potential biological infection. He suffers from diabetes. Past Medical History:  Diagnosis Date  . Diabetes mellitus without complication (HCC)     There are no problems to display for this patient.   History reviewed. No pertinent surgical history.     Home Medications    Prior to Admission medications   Medication Sig Start Date End Date Taking? Authorizing Provider  cephALEXin (KEFLEX) 500 MG capsule Take 1 capsule (500 mg total) by mouth 4 (four) times daily. 04/10/18   Ivery Quale, PA-C  diclofenac (VOLTAREN) 75 MG EC tablet Take 1 tablet (75 mg total) by mouth 2 (two) times daily. 11/23/16   Ivery Quale, PA-C  doxycycline (VIBRAMYCIN) 100 MG capsule Take 1 capsule (100 mg total) by mouth 2 (two) times daily. 11/23/16   Ivery Quale, PA-C  glipiZIDE-metformin (METAGLIP) 5-500 MG tablet Take 1 tablet by mouth 2 (two) times daily before a meal. 11/07/20 12/07/20  Moshe Cipro, NP  HYDROcodone-acetaminophen (NORCO/VICODIN) 5-325 MG tablet Take 1 tablet by mouth every 4 (four) hours as needed for severe pain. 12/31/17   Burgess Amor, PA-C  ibuprofen (ADVIL,MOTRIN) 600 MG tablet Take 1 tablet (600 mg total) by mouth 4 (four) times daily. 04/10/18   Ivery Quale, PA-C    Family History Family History  Family history unknown: Yes    Social History Social History   Tobacco Use  . Smoking status: Never Smoker  . Smokeless tobacco: Never Used  Vaping Use  . Vaping Use:  Never used  Substance Use Topics  . Alcohol use: No  . Drug use: No     Allergies   Patient has no known allergies.   Review of Systems Review of Systems Pertinent negatives listed in HPI  Physical Exam Triage Vital Signs ED Triage Vitals  Enc Vitals Group     BP 01/28/21 1841 130/82     Pulse Rate 01/28/21 1841 (!) 105     Resp 01/28/21 1841 18     Temp 01/28/21 1841 98.5 F (36.9 C)     Temp Source 01/28/21 1841 Oral     SpO2 01/28/21 1841 96 %     Weight --      Height --      Head Circumference --      Peak Flow --      Pain Score 01/28/21 1840 5     Pain Loc --      Pain Edu? --      Excl. in GC? --    No data found.  Updated Vital Signs BP 130/82 (BP Location: Right Arm)   Pulse (!) 105   Temp 98.5 F (36.9 C) (Oral)   Resp 18   SpO2 96%   Visual Acuity Right Eye Distance:   Left Eye Distance:   Bilateral Distance:    Right Eye Near:   Left Eye Near:  Bilateral Near:     Physical Exam   UC Treatments / Results  Labs (all labs ordered are listed, but only abnormal results are displayed) Labs Reviewed - No data to display  EKG   Radiology No results found.  Procedures Procedures (including critical care time)  Medications Ordered in UC Medications - No data to display  Initial Impression / Assessment and Plan / UC Course  I have reviewed the triage vital signs and the nursing notes.  Pertinent labs & imaging results that were available during my care of the patient were reviewed by me and considered in my medical decision making (see chart for details).      Human bite which is mostly superficial, no deep breakage of skin, nailbed remains intact, will cover with Augmentin twice daily for 10 days.  HIV, hepatitis panel, and CBC are pending.  Patient advised we will notify him only if lab work is abnormal.  Otherwise complete medication monitor for signs of infection return if any signs of infection develop.  Tdap updated.  Final  Clinical Impressions(s) / UC Diagnoses   Final diagnoses:  Human bite of left thumb  Laceration of left thumb without foreign body with damage to nail, initial encounter  Type 2 diabetes mellitus without complication, without long-term current use of insulin [facility])     Discharge Instructions     Your labs will result within 72 hours. If all labs are normal no one from our clinic will contact you. Complete all medication and monitor for signs of infection. If any swelling or drainage develops at the site of the bite wound, return for evaluation.    ED Prescriptions    Medication Sig Dispense Auth. Provider   amoxicillin-clavulanate (AUGMENTIN) 875-125 MG tablet Take 1 tablet by mouth 2 (two) times daily. 20 tablet Bing Neighbors, FNP     PDMP not reviewed this encounter.   Bing Neighbors, FNP 02/02/21 2019

## 2021-01-28 NOTE — ED Triage Notes (Signed)
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 Pt states that it happen today. Pt would like labs. Pt is not up to date on tetanus.

## 2021-01-30 LAB — HIV ANTIBODY (ROUTINE TESTING W REFLEX): HIV Screen 4th Generation wRfx: NONREACTIVE

## 2021-01-30 LAB — CBC
Hematocrit: 43.7 % (ref 37.5–51.0)
Hemoglobin: 14.2 g/dL (ref 13.0–17.7)
MCH: 27.1 pg (ref 26.6–33.0)
MCHC: 32.5 g/dL (ref 31.5–35.7)
MCV: 83 fL (ref 79–97)
Platelets: 380 10*3/uL (ref 150–450)
RBC: 5.24 x10E6/uL (ref 4.14–5.80)
RDW: 13 % (ref 11.6–15.4)
WBC: 8.1 10*3/uL (ref 3.4–10.8)

## 2021-01-30 LAB — HEPATITIS PANEL, ACUTE
Hep A IgM: NEGATIVE
Hep B C IgM: NEGATIVE
Hep C Virus Ab: 0.2 s/co ratio (ref 0.0–0.9)
Hepatitis B Surface Ag: NEGATIVE

## 2021-09-25 ENCOUNTER — Ambulatory Visit
Admission: EM | Admit: 2021-09-25 | Discharge: 2021-09-25 | Disposition: A | Discharge status: Home / Self Care | Payer: Commercial Managed Care - PPO

## 2021-09-25 ENCOUNTER — Other Ambulatory Visit: Payer: Self-pay

## 2021-09-25 DIAGNOSIS — Z760 Encounter for issue of repeat prescription: Secondary | ICD-10-CM

## 2021-09-25 MED ORDER — GLIPIZIDE-METFORMIN HCL 5-500 MG PO TABS: 1.0000 | ORAL_TABLET | Freq: Two times a day (BID) | ORAL | 0 refills | Status: AC

## 2021-09-25 MED ORDER — TADALAFIL 20 MG PO TABS: 20.0000 mg | ORAL_TABLET | ORAL | 0 refills | Status: AC | PRN

## 2021-09-25 NOTE — Discharge Instructions (Signed)
Follow up with your Physicain for recheck  

## 2021-09-25 NOTE — ED Provider Notes (Signed)
[facility]    CSN: [id number] Arrival date & time: 09/25/21  1118      History   Chief Complaint Chief Complaint  Patient presents with   Medication Refill    HPI Ricardo Oneill is a 50 y.o. male.   Pt request medication refill.   The history is provided by the patient. No language interpreter was used.  Medication Refill Medications/supplies requested:  Metformin and cialis Reason for request:  Clinic/provider not available Medications taken before: yes - see home medications   Patient has complete original prescription information: no    Past Medical History:  Diagnosis Date   Diabetes mellitus without complication (HCC)     There are no problems to display for this patient.   History reviewed. No pertinent surgical history.     Home Medications    Prior to Admission medications   Medication Sig Start Date End Date Taking? Authorizing Provider  ARIPiprazole (ABILIFY) 20 MG tablet TAKE ONE TABLET BY MOUTH EVERY NIGHT AT BEDTIME FOR MOOD AUGMENTATION 09/07/21  Yes [provider]  buPROPion (WELLBUTRIN XL) 300 MG 24 hr tablet TAKE ONE TABLET BY MOUTH EVERY MORNING FOR DEPRESSION AND MOOD CONTROL 06/30/21  Yes [provider]  clonazePAM (KLONOPIN) 0.5 MG tablet TAKE ONE TABLET BY MOUTH TWICE DAILY AS NEEDED FOR PERSISTENT ANXIETY 06/30/21  Yes [provider]  ketoconazole (NIZORAL) 2 % cream APPLY A SMALL AMOUNT TOPICALLY EVERY DAY 06/30/21  Yes [provider]  PARoxetine (PAXIL) 30 MG tablet TAKE ONE TABLET BY MOUTH EVERY MORNING FOR PTSD, ANXIETY AND DEPRESSION 09/07/21  Yes [provider]  pravastatin (PRAVACHOL) 10 MG tablet TAKE ONE TABLET BY MOUTH EVERY NIGHT AT BEDTIME FOR CHOLESTEROL 06/30/21  Yes [provider]  tadalafil (CIALIS) 20 MG tablet Take 1 tablet (20 mg total) by mouth as needed for erectile dysfunction. 09/25/21 09/25/22 Yes Cheron Schaumann K, PA-C  traZODone (DESYREL) 100 MG  tablet TAKE TWO TABLETS BY MOUTH EVERY NIGHT AT BEDTIME  FOR SLEEP 06/30/21  Yes [provider]  amoxicillin-clavulanate (AUGMENTIN) 875-125 MG tablet Take 1 tablet by mouth 2 (two) times daily. 01/28/21   Bing Neighbors, FNP  diclofenac (VOLTAREN) 75 MG EC tablet Take 1 tablet (75 mg total) by mouth 2 (two) times daily. 11/23/16   Ivery Quale, PA-C  glipiZIDE-metformin (METAGLIP) 5-500 MG tablet Take 1 tablet by mouth 2 (two) times daily before a meal. 09/25/21 10/25/21  Elson Areas, PA-C  HYDROcodone-acetaminophen (NORCO/VICODIN) 5-325 MG tablet Take 1 tablet by mouth every 4 (four) hours as needed for severe pain. 12/31/17   Burgess Amor, PA-C  ibuprofen (ADVIL,MOTRIN) 600 MG tablet Take 1 tablet (600 mg total) by mouth 4 (four) times daily. 04/10/18   Ivery Quale, PA-C    Family History Family History  Problem Relation Age of Onset   Stroke Mother    Healthy Father     Social History Social History   Tobacco Use   Smoking status: Never   Smokeless tobacco: Never  Vaping Use   Vaping Use: Never used  Substance Use Topics   Alcohol use: No   Drug use: No     Allergies   Patient has no known allergies.   Review of Systems Review of Systems  All other systems reviewed and are negative.   Physical Exam Triage Vital Signs ED Triage Vitals  Enc Vitals Group     BP 09/25/21 1151 108/75     Pulse Rate 09/25/21 1151  79     Resp 09/25/21 1151 20     Temp 09/25/21 1151 98 F (36.7 C)     Temp Source 09/25/21 1151 Oral     SpO2 09/25/21 1151 93 %     Weight --      Height --      Head Circumference --      Peak Flow --      Pain Score 09/25/21 1148 0     Pain Loc --      Pain Edu? --      Excl. in GC? --    No data found.  Updated Vital Signs BP 108/75 (BP Location: Right Arm)    Pulse 79    Temp 98 F (36.7 C) (Oral)    Resp 20    SpO2 93%   Visual Acuity Right Eye Distance:   Left Eye Distance:   Bilateral Distance:    Right Eye Near:    Left Eye Near:    Bilateral Near:     Physical Exam Vitals and nursing note reviewed.  Constitutional:      Appearance: He is well-developed.  HENT:     Head: Normocephalic.  Cardiovascular:     Rate and Rhythm: Normal rate.  Pulmonary:     Effort: Pulmonary effort is normal.  Abdominal:     General: There is no distension.  Musculoskeletal:        General: Normal range of motion.     Cervical back: Normal range of motion.  Neurological:     Mental Status: He is alert and oriented to person, place, and time.     UC Treatments / Results  Labs (all labs ordered are listed, but only abnormal results are displayed) Labs Reviewed - No data to display  EKG   Radiology No results found.  Procedures Procedures (including critical care time)  Medications Ordered in UC Medications - No data to display  Initial Impression / Assessment and Plan / UC Course  I have reviewed the triage vital signs and the nursing notes.  Pertinent labs & imaging results that were available during my care of the patient were reviewed by me and considered in my medical decision making (see chart for details).     MDM:  Pt advised to discuss refills with his MD  Final Clinical Impressions(s) / UC Diagnoses   Final diagnoses:  Medication refill     Discharge Instructions      Follow up with your Physicain for recheck    ED Prescriptions     Medication Sig Dispense Auth. Provider   glipiZIDE-metformin (METAGLIP) 5-500 MG tablet Take 1 tablet by mouth 2 (two) times daily before a meal. 60 tablet Joanna Borawski K, PA-C   tadalafil (CIALIS) 20 MG tablet Take 1 tablet (20 mg total) by mouth as needed for erectile dysfunction. 20 tablet Elson Areas, [place]      PDMP not reviewed this encounter.   Elson Areas, [place] 09/25/21 1908

## 2021-09-25 NOTE — ED Triage Notes (Signed)
Patient states he needs his Metformin and Tadalafil refilled.

## 2021-12-03 ENCOUNTER — Encounter: Payer: Self-pay | Admitting: *Deleted

## 2022-02-02 ENCOUNTER — Telehealth: Payer: Self-pay | Admitting: *Deleted

## 2022-02-02 ENCOUNTER — Ambulatory Visit: Payer: Self-pay

## 2022-02-02 NOTE — Telephone Encounter (Signed)
Tried to call pt for 11:00 nurse visit by phone.  Had to leave a voice mail. ?

## 2022-04-27 ENCOUNTER — Ambulatory Visit: Payer: Commercial Managed Care - PPO | Admitting: Nutrition

## 2024-04-18 LAB — MICROALBUMIN / CREATININE URINE RATIO: Microalb Creat Ratio: <4

## 2024-04-18 LAB — HEMOGLOBIN A1C: Hemoglobin A1C: 11.7

## 2024-06-22 LAB — HEMOGLOBIN A1C: Hemoglobin A1C: 11.1

## 2024-06-22 LAB — COMPREHENSIVE METABOLIC PANEL WITH GFR: eGFR: 73

## 2024-06-22 LAB — LIPID PANEL
LDL Cholesterol: 181
Triglycerides: 181 — AB (ref 40–160)

## 2024-06-22 LAB — MICROALBUMIN / CREATININE URINE RATIO: Microalb Creat Ratio: 5

## 2024-06-22 LAB — BASIC METABOLIC PANEL WITH GFR
BUN: 16 (ref 4–21)
Creatinine: 1.2 (ref 0.6–1.3)
Glucose: 225

## 2024-08-10 NOTE — Patient Instructions (Signed)

## 2024-08-15 ENCOUNTER — Encounter: Payer: Self-pay | Admitting: Nurse Practitioner

## 2024-08-15 DIAGNOSIS — E782 Mixed hyperlipidemia: Secondary | ICD-10-CM

## 2024-08-15 DIAGNOSIS — Z7984 Long term (current) use of oral hypoglycemic drugs: Secondary | ICD-10-CM

## 2024-08-15 DIAGNOSIS — E1165 Type 2 diabetes mellitus with hyperglycemia: Secondary | ICD-10-CM

## 2024-08-15 NOTE — Progress Notes (Signed)
 Erroneous encounter
